# Patient Record
Sex: Male | Born: 1975
Health system: Southern US, Community
[De-identification: ages and names within clinical notes are randomized; demographics above are authoritative.]

## PROBLEM LIST (undated history)

## (undated) DIAGNOSIS — E785 Hyperlipidemia, unspecified: Secondary | ICD-10-CM

## (undated) DIAGNOSIS — G4700 Insomnia, unspecified: Secondary | ICD-10-CM

## (undated) HISTORY — PX: SHOULDER ARTHROSCOPY: SHX128

## (undated) HISTORY — PX: HYDROCELE EXCISION / REPAIR: SUR1145

## (undated) HISTORY — PX: TENDON REPAIR: SHX5111

## (undated) HISTORY — DX: Hyperlipidemia, unspecified: E78.5

## (undated) HISTORY — DX: Insomnia, unspecified: G47.00

---

## 2005-11-14 ENCOUNTER — Emergency Department (HOSPITAL_COMMUNITY): Admission: EM | Admit: 2005-11-14 | Discharge: 2005-11-14 | Payer: Self-pay | Admitting: Family Medicine

## 2005-12-22 ENCOUNTER — Emergency Department (HOSPITAL_COMMUNITY): Admission: EM | Admit: 2005-12-22 | Discharge: 2005-12-22 | Payer: Self-pay | Admitting: Emergency Medicine

## 2006-12-24 ENCOUNTER — Encounter: Admission: RE | Admit: 2006-12-24 | Discharge: 2006-12-24 | Payer: Self-pay | Admitting: Orthopedic Surgery

## 2007-01-11 ENCOUNTER — Emergency Department (HOSPITAL_COMMUNITY): Admission: EM | Admit: 2007-01-11 | Discharge: 2007-01-11 | Payer: Self-pay | Admitting: Family Medicine

## 2007-02-10 ENCOUNTER — Emergency Department (HOSPITAL_COMMUNITY): Admission: EM | Admit: 2007-02-10 | Discharge: 2007-02-10 | Payer: Self-pay | Admitting: Family Medicine

## 2014-05-09 ENCOUNTER — Encounter (HOSPITAL_BASED_OUTPATIENT_CLINIC_OR_DEPARTMENT_OTHER): Payer: Self-pay | Admitting: Emergency Medicine

## 2014-05-09 ENCOUNTER — Emergency Department (HOSPITAL_BASED_OUTPATIENT_CLINIC_OR_DEPARTMENT_OTHER)
Admission: EM | Admit: 2014-05-09 | Discharge: 2014-05-09 | Disposition: A | Discharge status: Home / Self Care | Payer: 59 | Attending: Emergency Medicine | Admitting: Emergency Medicine

## 2014-05-09 DIAGNOSIS — Z23 Encounter for immunization: Secondary | ICD-10-CM | POA: Insufficient documentation

## 2014-05-09 DIAGNOSIS — Z203 Contact with and (suspected) exposure to rabies: Secondary | ICD-10-CM

## 2014-05-09 MED ORDER — RABIES VACCINE, PCEC IM SUSR
1.0000 mL | Freq: Once | INTRAMUSCULAR | Status: AC
  Administered 2014-05-09: 1 mL via INTRAMUSCULAR
  Filled 2014-05-09 (×2): qty 1

## 2014-05-09 MED ORDER — RABIES IMMUNE GLOBULIN 150 UNIT/ML IM INJ
20.0000 [IU]/kg | INJECTION | Freq: Once | INTRAMUSCULAR | Status: AC
  Administered 2014-05-09: 1725 [IU] via INTRAMUSCULAR
  Filled 2014-05-09: qty 12

## 2014-05-09 NOTE — ED Provider Notes (Signed)
TIME SEEN: 4:10 PM  CHIEF COMPLAINT: Possible rabies exposure  HPI: Patient is a 38 y.o. M with no significant past medical history who is up-to-date on his vaccinations who presents to the emergency department with a possible rabies exposure. Patient states that 2 days ago he helped a friend of the family get rid of a bat that they found in the their house. He states that that was asleep on the top of a picture frame. He states he grabbed the bat with a handful of paper towels. He states he was wearing thin latex gloves. He reports he took the bat outside and killed it by stepping on its head and crushing its skull. He denies any bites. He threw the bat in a trash can after he killed it. When the family talked to animal control today, they recommended the patient's and family of the house but that was found come to the emergency department to receive the rabies vaccination and immunoglobulin. They state the bat cannot be used for testing because it was in the heat for over 24 hours.  Patient states he is feeling well. He denies any current medical complaints.  ROS: See HPI Constitutional: no fever  Eyes: no drainage  ENT: no runny nose   Cardiovascular:  no chest pain  Resp: no SOB  GI: no vomiting GU: no dysuria Integumentary: no rash  Allergy: no hives  Musculoskeletal: no leg swelling  Neurological: no slurred speech ROS otherwise negative  PAST MEDICAL HISTORY/PAST SURGICAL HISTORY:  History reviewed. No pertinent past medical history.  MEDICATIONS:  Prior to Admission medications   Not on File    ALLERGIES:  No Known Allergies  SOCIAL HISTORY:  History  Substance Use Topics  . Smoking status: Never Smoker   . Smokeless tobacco: Not on file  . Alcohol Use: Yes    FAMILY HISTORY: No family history on file.  EXAM: BP 125/95  Pulse 59  Temp(Src) 98.2 F (36.8 C) (Oral)  Resp 16  Wt 189 lb 1.6 oz (85.775 kg)  SpO2 100% CONSTITUTIONAL: Alert and oriented and responds  appropriately to questions. Well-appearing; well-nourished, pleasant, smiling, nontoxic HEAD: Normocephalic EYES: Conjunctivae clear, PERRL ENT: normal nose; no rhinorrhea; moist mucous membranes; pharynx without lesions noted NECK: Supple, no meningismus, no LAD  CARD: RRR; S1 and S2 appreciated; no murmurs, no clicks, no rubs, no gallops RESP: Normal chest excursion without splinting or tachypnea; breath sounds clear and equal bilaterally; no wheezes, no rhonchi, no rales,  ABD/GI: Normal bowel sounds; non-distended; soft, non-tender, no rebound, no guarding BACK:  The back appears normal and is non-tender to palpation, there is no CVA tenderness EXT: Normal ROM in all joints; non-tender to palpation; no edema; normal capillary refill; no cyanosis    SKIN: Normal color for age and race; warm NEURO: Moves all extremities equally, sensation to light touch intact diffusely, cranial nerves II through XII intact, normal gait PSYCH: The patient's mood and manner are appropriate. Grooming and personal hygiene are appropriate.  MEDICAL DECISION MAKING: Patient here with possible rabies exposure. We'll give immunoglobulin and vaccine. We'll give the vaccine schedule and they will followup as an outpatient for further vaccinations. Have discussed supportive care instructions and return precautions. Patient verbalizes understanding and is comfortable plan.     [place], DO 05/09/14 1614

## 2014-05-09 NOTE — ED Notes (Signed)
Bat exposure 6/23

## 2014-05-09 NOTE — ED Notes (Signed)
MD at bedside. 

## 2014-05-09 NOTE — Discharge Instructions (Signed)
Rabies Vaccine   What You Need to Know   WHAT IS RABIES?   Rabies is a serious disease. It is caused by a virus.   Rabies is mainly a disease of animals. Humans get rabies when they are bitten by infected animals.   At first there might not be any symptoms. But weeks, or even years after a bite, rabies can cause pain, fatigue, headaches, fever, and irritability. These are followed by seizures, hallucinations, and paralysis. Human rabies is almost always fatal.   Wild animals, especially bats, are the most common source of human rabies infection in the [place]. Skunks, raccoons, dogs, cats, coyotes, foxes, and other mammals can also transmit the disease.   Human rabies is rare in the [place]. There have been only 55 cases diagnosed since 1990. However, between 16,000 and 39,000 people are vaccinated each year as a precaution after animal bites. Also, rabies is far more common in other parts of the world, with about 40,000 to 70,000 rabies-related deaths worldwide each year. Bites from unvaccinated dogs cause most of these cases.  Rabies vaccine can prevent rabies.   RABIES VACCINE   Rabies vaccine is given to people at high risk of rabies to protect them if they are exposed. It can also prevent the disease if it is given to a person after they have been exposed.   Rabies vaccine is made from killed rabies virus. It cannot cause rabies.  WHO SHOULD GET RABIES VACCINE AND WHEN?   Preventive Vaccination (No Exposure)   People at high risk of exposure to rabies, such as veterinarians, animal handlers, rabies laboratory workers, spelunkers, and rabies biologics production workers should be offered rabies vaccine.   The vaccine should also be considered for:   People whose activities bring them into frequent contact with rabies virus or with possibly rabid animals.   International travelers who are likely to come in contact with animals in parts of the world where rabies is common.  The pre-exposure schedule  for rabies vaccination is 3 doses, given at the following times:   Dose 1: As appropriate.   Dose 2: 7 days after Dose 1.   Dose 3: 21 days or 28 days after Dose 1.  For laboratory workers and others who may be repeatedly exposed to rabies virus, periodic testing for immunity is recommended and booster doses should be given as needed. (Testing or booster doses are not recommended for travelers). Ask your doctor for details.  Vaccination After an Exposure   Anyone who has been bitten by an animal, or who otherwise may have been exposed to rabies, should clean the wound and see a doctor immediately. The doctor will determine if they need to be vaccinated.   A person who is exposed and has never been vaccinated against rabies should get 4 doses of rabies vaccine: one dose right away and additional doses on the 3rd, 7th, and 14th days. They should also get another shot called Rabies Immune Globulin at the same time as the first dose.   A person who has been previously vaccinated should get 2 doses of rabies vaccine: one right away and another on the 3rd day. Rabies Immune Globulin is not needed.   TELL YOUR DOCTOR IF:   Talk with a doctor before getting rabies vaccine if you:   Ever had a serious (life-threatening) allergic reaction to a previous dose of rabies vaccine or to any component of the vaccine; tell your doctor if you have any   severe allergies.   Have a weakened immune system because of:   HIV, AIDS, or another disease that affects the immune system.   Treatment with drugs that affect the immune system, such as steroids.   Cancer or cancer treatment with radiation or drugs.  If you have a minor illness, such as a cold, you can be vaccinated. If you are moderately or severely ill, you should probably wait until you recover before getting a routine (non-exposure) dose of rabies vaccine.   If you have been exposed to rabies virus, you should get the vaccine regardless of any other illnesses you may have.   WHAT  ARE THE RISKS FROM RABIES VACCINE?   A vaccine, like any medicine, is capable of causing serious problems, such as severe allergic reactions. The risk of a vaccine causing serious harm, or death, is extremely small. Serious problems from rabies vaccine are very rare.   Mild problems:   Soreness, redness, swelling, or itching where the shot was given (30% to 74%).   Headache, nausea, abdominal pain, muscle aches, or dizziness (5% to 40%).  Moderate problems:   Hives, pain in the joints, or fever (about 6% of booster doses).   Other nervous system disorders, such as Guillain-Barré Syndrome (GBS), have been reported after rabies vaccine, but this happens so rarely that it is not known whether they are related to the vaccine.  Note: Several brands of rabies vaccine are available in the [place], and reactions may vary between brands. Your provider can give you more information about a particular brand.   WHAT IF THERE IS A SERIOUS REACTION?   What should I look for?   Look for anything that concerns you, such as signs of a severe allergic reaction, very high fever, or behavior changes.   Signs of a severe allergic reaction can include hives, swelling of the face and throat, difficulty breathing, a fast heartbeat, dizziness, and weakness. These would start a few minutes to a few hours after the vaccination.   What should I do?   If you think it is a severe allergic reaction or other emergency that cannot wait, call 911 or get the person to the nearest hospital. Otherwise, call your doctor.   Afterward, the reaction should be reported to the Vaccine Adverse Event Reporting System (VAERS). Your doctor might file this report, or you can do it yourself through the VAERS website at www.vaers.hhs.gov or by calling 1-800-822-7967.  VAERS is only for reporting reactions. They do not give medical advice.   HOW CAN I LEARN MORE?   Ask your doctor.   Call your local or state health department.   Contact the Centers for Disease  Control and Prevention (CDC):   Visit the CDC rabies website at www.cdc.gov/rabies/  CDC Rabies Vaccine VIS (08/20/08)   Document Released: 08/29/2006 Document Revised: 10/18/2012 Document Reviewed: 02/21/2013   ExitCare® Patient Information ©2015 [employer]. This information is not intended to replace advice given to you by your health care provider. Make sure you discuss any questions you have with your health care provider.

## 2014-05-12 ENCOUNTER — Emergency Department (HOSPITAL_COMMUNITY)
Admission: EM | Admit: 2014-05-12 | Discharge: 2014-05-12 | Disposition: A | Discharge status: Home / Self Care | Payer: 59 | Source: Home / Self Care

## 2014-05-12 ENCOUNTER — Encounter (HOSPITAL_COMMUNITY): Payer: Self-pay | Admitting: Emergency Medicine

## 2014-05-12 MED ORDER — RABIES VACCINE, PCEC IM SUSR
INTRAMUSCULAR | Status: AC
  Filled 2014-05-12: qty 1

## 2014-05-12 MED ORDER — RABIES VACCINE, PCEC IM SUSR
1.0000 mL | Freq: Once | INTRAMUSCULAR | Status: AC
  Administered 2014-05-12: 1 mL via INTRAMUSCULAR

## 2014-05-12 NOTE — ED Notes (Signed)
Patient presents for second rabies injection. Denies rx to previous injections. Patient is aware of his schedule for vaccines.

## 2014-05-16 ENCOUNTER — Emergency Department
Admission: EM | Admit: 2014-05-16 | Discharge: 2014-05-16 | Disposition: A | Discharge status: Home / Self Care | Payer: 59 | Source: Home / Self Care

## 2014-05-16 ENCOUNTER — Encounter: Payer: Self-pay | Admitting: Emergency Medicine

## 2014-05-16 DIAGNOSIS — Z23 Encounter for immunization: Secondary | ICD-10-CM

## 2014-05-16 DIAGNOSIS — Z203 Contact with and (suspected) exposure to rabies: Secondary | ICD-10-CM

## 2014-05-16 MED ORDER — RABIES VACCINE, PCEC IM SUSR
1.0000 mL | Freq: Once | INTRAMUSCULAR | Status: AC
  Administered 2014-05-16: 1 mL via INTRAMUSCULAR

## 2014-05-16 NOTE — ED Notes (Signed)
Here for #3 of Rabies vaccination series. No previous adverse reaction to this vaccination.

## 2014-05-22 NOTE — ED Notes (Signed)
Mr. Sponaugle called and is on vacation at [facility], [place].  States he needs an order faxed to the [facility] , Attn: Janett Billow, Out Pt inorder to receive his 4th rabies vaccine.  Chart reviewed with Dr. Leonides Schanz.  Order written for Rabies Vaccine, 1 ML IM to be given on 05/23/14.  Faxed to [phone].

## 2015-05-22 ENCOUNTER — Ambulatory Visit: Payer: Self-pay | Admitting: Sports Medicine

## 2015-05-27 ENCOUNTER — Encounter: Payer: Self-pay | Admitting: Sports Medicine

## 2015-05-27 ENCOUNTER — Ambulatory Visit (INDEPENDENT_AMBULATORY_CARE_PROVIDER_SITE_OTHER): Payer: 59 | Admitting: Sports Medicine

## 2015-05-27 VITALS — BP 114/81 | Ht 70.0 in | Wt 185.0 lb

## 2015-05-27 DIAGNOSIS — M25562 Pain in left knee: Secondary | ICD-10-CM

## 2015-05-27 NOTE — Progress Notes (Signed)
  Patrick Clayton - 39 y.o. male MRN [id number]  Date of birth: 19-Dec-1975  SUBJECTIVE:  Including CC & ROS.   New patient evaluation for left posterior knee pain. Pain has been present since he was about 39 years old. Denies any trauma or major injury to the knee. When he was younger he first noticed the pain when in cross-legged sitting position, felt like the leg would get very tight and had to slowly extend his knee getting out of this position. As he has gotten older, he develops progressive tightening over a period of about 3 weeks where eventually he feels the back of the leg get very tight and eventually "pops" on extension and causes significant relief of the pain. This cycle occurs every 3-4 months. He denies any swelling of the leg, doesn't get any numbness or tingling and does not feel weak. He is physically active, including weight lifting (used to do a lot of squats per note he brings in from orthopedic doctor 4 years ago) and running 12 miles a week; notably he did . He has been evaluated by several orthopedic doctors and normal MRI, and recently underwent more physical therapy visits in June that had a "popping" episode on June 30th and has been doing much better.  Orthopedic notes suggest when he started a group of jump squats he worsened his symptoms and he gets some locking at times.   HISTORY: Past Medical, Surgical, Social, and Family History Reviewed & Updated per EMR. Pertinent Historical Findings include: Right MCL tear Left shoulder surgery?  PHYSICAL EXAM:  VS: BP:114/81 mmHg  HR: bpm  TEMP: ( )  RESP:   HT:5\' 10"  (177.8 cm)   WT:185 lb (83.915 kg)  BMI:26.6 PHYSICAL EXAM: General: NAD, pleasant Skin: no rashes, no erythema, no ecchymoses Neuro: alert and oriented x 3, no sensory deficits of LE.  MSK: Knee: Normal to inspection with no erythema or effusion or obvious bony abnormalities. Palpation normal with no warmth or joint line tenderness or patellar tenderness  or condyle tenderness. ROM normal in flexion and extension and lower leg rotation. Ligaments with solid consistent endpoints including ACL, PCL, LCL, MCL. Patellar and quadriceps tendons unremarkable. Hamstring and quadriceps strength is normal. Provocative testing: Dial test positive: extended = left side rotation 60 degrees and 45 degrees, right side rotation = 60 degrees and 20 degrees. With 90 degree flexion both sides are 60 degrees and 45 degrees.  Ultrasound: apparent posterior lateral corner soft tissue tear with loose soft tissue seen. Post horn of meniscus appears intact.  There is some hypoechoic change near area of tissue irregularity.  ASSESSMENT & PLAN: See problem based charting & AVS for pt instructions.

## 2015-05-27 NOTE — Patient Instructions (Signed)
Hamstring curls  60lb over shoulder, step out with your foot inwardly rotated (pointing toward opposite side of body), each step you do lateral, in front of your body, and slightly across midline. 3 sets of 15 for each position. When starting this be careful with your steps, you may want to do it without weights until you get the form down.  Squats down to about 60 degrees  At least 3 times per week, more would be better if you can tolerate.  Call around 6 weeks to report how you are doing, and if not getting a lot better schedule an appt. Likely repeat the ultrasound in about 3 months.

## 2015-05-27 NOTE — Assessment & Plan Note (Addendum)
Mechanism consistent with posterolateral corner injury. Positive dial testing on the left with increased rotation seen. Ultrasound of the posterior knee demonstrated an area of apparent free soft tissue (?ligament vs capsule vs cartilage) that is likely causing the popping sensation he is getting. Went over HEP (see AVS). Have him follow up via phone in 6 weeks to touch base and bring back if needed then, otherwise likely f/u at 3 months with repeat ultrasound.  [facility]

## 2016-08-10 ENCOUNTER — Encounter: Payer: Self-pay | Admitting: Sports Medicine

## 2016-08-10 ENCOUNTER — Ambulatory Visit (INDEPENDENT_AMBULATORY_CARE_PROVIDER_SITE_OTHER): Payer: 59 | Admitting: Sports Medicine

## 2016-08-10 VITALS — BP 116/76 | HR 54 | Ht 70.0 in | Wt 177.0 lb

## 2016-08-10 DIAGNOSIS — M25562 Pain in left knee: Secondary | ICD-10-CM | POA: Diagnosis not present

## 2016-08-10 NOTE — Progress Notes (Signed)
Chief complaint: left anterior knee pain  Patient had a remote injury in childhood that affected his left knee We saw him this past year for some posterior lateral corner issues of the left knee These improved with treatment  Over the past 3 months he has been doing a lot of training in which he was running both up and downhill He is getting anterior knee pain This is associated with slight lateral swelling Much worse on downhill Pain became fairly severe during the [place] relay  Social history-nonsmoker Works out regularly with running exercises and spin  Review of systems No locking No giving way Pain any 1 leg squat or kneel bend Pain with stepping down with his left foot  Physical examination Muscular white male in no acute distress BP 116/76   Pulse (!) 54   Ht 5\' 10"  (1.778 m)   Wt 177 lb (80.3 kg)   BMI 25.40 kg/m   Knee Left Normal to inspection with no erythema or effusion or obvious bony abnormalities. Palpation normal with no warmth or joint line tenderness or patellar tenderness or condyle tenderness. ROM normal in flexion and extension and lower leg rotation. Ligaments with solid consistent endpoints including ACL, PCL, LCL, MCL. Negative Mcmurray's and provocative meniscal tests.  Painful lateral patellar compression. Lateral crepitation beneath patella Lateral patellar tracking Note not noted on comparison exam of RT knee  Patellar and quadriceps tendons unremarkable. Hamstring and quadriceps strength is normal. Excellent hip abduction strength

## 2016-08-10 NOTE — Assessment & Plan Note (Signed)
We'll try open patellar compression sleeve Exercises to emphasize medial and VMO strength Avoid downhill running Avoid squats and 1 leg exercise  Referral to physical therapy to try to work with him on dynamic exercises he cannot really do even a step down without pain today  Recheck 6 weeks

## 2016-11-30 ENCOUNTER — Ambulatory Visit (INDEPENDENT_AMBULATORY_CARE_PROVIDER_SITE_OTHER): Payer: 59 | Admitting: Sports Medicine

## 2016-11-30 ENCOUNTER — Encounter: Payer: Self-pay | Admitting: Sports Medicine

## 2016-11-30 DIAGNOSIS — M5432 Sciatica, left side: Secondary | ICD-10-CM

## 2016-11-30 MED ORDER — GABAPENTIN 300 MG PO CAPS: 300.0000 mg | ORAL_CAPSULE | Freq: Every day | ORAL | 2 refills | Status: DC

## 2016-11-30 MED ORDER — VITAMIN B-6 100 MG PO TABS: 100.0000 mg | ORAL_TABLET | Freq: Every day | ORAL | 1 refills | Status: DC

## 2016-11-30 NOTE — Progress Notes (Signed)
    Subjective:  Patrick Clayton is a 41 y.o. male who presents to the [facility] today with a chief complaint of left leg pain.   HPI:  Left Leg Pain Symptoms started about 2 days ago. Five days ago the patient ran 9 miles and ran 6 miles the day after. Two days ago he started noticed some pain in his left buttocks and hamstring area that felt like a "tightness." Pain radiates into the back of his knee, but not into his feet. He has been having some numbness and tingling into his feet. Over the past couple of days, he has noticed an improvement in his pain. He has tried taking meloxicam, ibuprofen, and tylenol which have helped some. Pain is described as a shooting pain.  ROS: Per HPI No LBP No deep buttocks pain  PMH: Smoking history reviewed.   Objective:  Physical Exam: BP 114/72   Ht 5\' 10"  (1.778 m)   Wt 175 lb (79.4 kg)   BMI 25.11 kg/m   Gen: NAD, resting comfortably MSK:  - LLE: No deformities. Mildly tender to palpation just lateral to ischial tuberosity. FROM. Strength 5/5 including hamstrings with no significant pain elicited. Strait leg raise negative. No pain with forward lunge. Askling H test negative. Neurovascularly intact distally.  - RLE: No deformities. Nontender to palpation. FROM. Strength 5/5 throughout. No pain with forward lung. askling H test negative. Straight leg raise negative. Neurovascularly intact distally.   Assessment/Plan:  Left sciatic nerve pain Pain likely secondary to sciatic nerve irritation/stretching given his area of tenderness and radiation of tingling into his feet. Doubt hamstring tear as patient has 5/5 strength and no pain with resistance. No red flag signs or symptoms. Discussed home exercise program including Diver's stretches, Extender stretches, and hamstring curls and swings. Will start gabapentin 300mg  at night and vitamin B6. Follow up in 1 month.   Algis Greenhouse. Jerline Pain, [place] Resident PGY-3 11/30/2016 5:02 PM  I  observed and examined the patient with the resident and agree with assessment and plan.  Note reviewed and modified by me. Stefanie Libel, MD

## 2016-11-30 NOTE — Patient Instructions (Signed)
Do four sets of 8 of each the diver and extender. Do three sets of 15 of the curls and swings. Start the gabapentin 300mg  at night. Start the vitamin B6  Come back in 4 weeks.

## 2016-11-30 NOTE — Assessment & Plan Note (Signed)
Pain likely secondary to sciatic nerve irritation/stretching given his area of tenderness and radiation of tingling into his feet. Doubt hamstring tear as patient has 5/5 strength and no pain with resistance. No red flag signs or symptoms. Discussed home exercise program including Diver's stretches, Extender stretches, and hamstring curls and swings. Will start gabapentin 300mg  at night and vitamin B6. Follow up in 1 month.

## 2016-12-03 ENCOUNTER — Encounter: Payer: Self-pay | Admitting: Sports Medicine

## 2016-12-21 ENCOUNTER — Other Ambulatory Visit: Payer: Self-pay | Admitting: Orthopedic Surgery

## 2016-12-21 DIAGNOSIS — M5416 Radiculopathy, lumbar region: Secondary | ICD-10-CM

## 2016-12-22 ENCOUNTER — Ambulatory Visit
Admission: RE | Admit: 2016-12-22 | Discharge: 2016-12-22 | Disposition: A | Discharge status: Home / Self Care | Payer: 59 | Source: Ambulatory Visit | Attending: Orthopedic Surgery | Admitting: Orthopedic Surgery

## 2016-12-22 DIAGNOSIS — M5416 Radiculopathy, lumbar region: Secondary | ICD-10-CM

## 2016-12-22 MED ORDER — IOPAMIDOL (ISOVUE-M 200) INJECTION 41%
1.0000 mL | Freq: Once | INTRAMUSCULAR | Status: AC
  Administered 2016-12-22: 1 mL via EPIDURAL

## 2016-12-22 MED ORDER — METHYLPREDNISOLONE ACETATE 40 MG/ML INJ SUSP (RADIOLOG
120.0000 mg | Freq: Once | INTRAMUSCULAR | Status: AC
  Administered 2016-12-22: 120 mg via EPIDURAL

## 2016-12-22 NOTE — Discharge Instructions (Signed)

## 2016-12-30 ENCOUNTER — Other Ambulatory Visit: Payer: Self-pay | Admitting: *Deleted

## 2016-12-30 MED ORDER — GABAPENTIN 300 MG PO CAPS: ORAL_CAPSULE | ORAL | 2 refills | Status: DC

## 2017-01-04 ENCOUNTER — Ambulatory Visit: Payer: 59 | Admitting: Sports Medicine

## 2017-07-21 ENCOUNTER — Other Ambulatory Visit: Payer: Self-pay | Admitting: Orthopedic Surgery

## 2017-07-21 DIAGNOSIS — M5416 Radiculopathy, lumbar region: Secondary | ICD-10-CM

## 2017-08-02 ENCOUNTER — Ambulatory Visit
Admission: RE | Admit: 2017-08-02 | Discharge: 2017-08-02 | Disposition: A | Discharge status: Home / Self Care | Payer: 59 | Source: Ambulatory Visit | Attending: Orthopedic Surgery | Admitting: Orthopedic Surgery

## 2017-08-02 DIAGNOSIS — M5416 Radiculopathy, lumbar region: Secondary | ICD-10-CM

## 2017-08-02 MED ORDER — IOPAMIDOL (ISOVUE-M 200) INJECTION 41%
1.0000 mL | Freq: Once | INTRAMUSCULAR | Status: AC
  Administered 2017-08-02: 1 mL via EPIDURAL

## 2017-08-02 MED ORDER — METHYLPREDNISOLONE ACETATE 40 MG/ML INJ SUSP (RADIOLOG
120.0000 mg | Freq: Once | INTRAMUSCULAR | Status: AC
  Administered 2017-08-02: 120 mg via EPIDURAL

## 2017-08-02 NOTE — Discharge Instructions (Signed)

## 2017-10-12 ENCOUNTER — Other Ambulatory Visit: Payer: Self-pay | Admitting: Orthopedic Surgery

## 2017-10-12 DIAGNOSIS — M5416 Radiculopathy, lumbar region: Secondary | ICD-10-CM

## 2017-11-14 ENCOUNTER — Ambulatory Visit
Admission: RE | Admit: 2017-11-14 | Discharge: 2017-11-14 | Disposition: A | Discharge status: Home / Self Care | Payer: 59 | Source: Ambulatory Visit | Attending: Orthopedic Surgery | Admitting: Orthopedic Surgery

## 2017-11-14 DIAGNOSIS — M5416 Radiculopathy, lumbar region: Secondary | ICD-10-CM

## 2017-11-14 MED ORDER — METHYLPREDNISOLONE ACETATE 40 MG/ML INJ SUSP (RADIOLOG
120.0000 mg | Freq: Once | INTRAMUSCULAR | Status: AC
  Administered 2017-11-14: 120 mg via EPIDURAL

## 2017-11-14 MED ORDER — IOPAMIDOL (ISOVUE-M 200) INJECTION 41%
1.0000 mL | Freq: Once | INTRAMUSCULAR | Status: AC
  Administered 2017-11-14: 1 mL via EPIDURAL

## 2017-11-14 NOTE — Discharge Instructions (Signed)

## 2017-11-23 ENCOUNTER — Other Ambulatory Visit: Payer: 59

## 2017-11-25 ENCOUNTER — Other Ambulatory Visit: Payer: 59

## 2017-12-05 ENCOUNTER — Other Ambulatory Visit: Payer: Self-pay | Admitting: Orthopedic Surgery

## 2017-12-05 DIAGNOSIS — M5416 Radiculopathy, lumbar region: Secondary | ICD-10-CM

## 2017-12-06 ENCOUNTER — Ambulatory Visit
Admission: RE | Admit: 2017-12-06 | Discharge: 2017-12-06 | Disposition: A | Discharge status: Home / Self Care | Payer: 59 | Source: Ambulatory Visit | Attending: Orthopedic Surgery | Admitting: Orthopedic Surgery

## 2017-12-06 DIAGNOSIS — M5416 Radiculopathy, lumbar region: Secondary | ICD-10-CM

## 2017-12-06 MED ORDER — IOPAMIDOL (ISOVUE-M 200) INJECTION 41%
1.0000 mL | Freq: Once | INTRAMUSCULAR | Status: AC
  Administered 2017-12-06: 1 mL via EPIDURAL

## 2017-12-06 MED ORDER — METHYLPREDNISOLONE ACETATE 40 MG/ML INJ SUSP (RADIOLOG
120.0000 mg | Freq: Once | INTRAMUSCULAR | Status: AC
  Administered 2017-12-06: 120 mg via EPIDURAL

## 2017-12-06 NOTE — Discharge Instructions (Signed)

## 2017-12-21 ENCOUNTER — Encounter (HOSPITAL_COMMUNITY): Payer: Self-pay

## 2017-12-21 ENCOUNTER — Ambulatory Visit (HOSPITAL_COMMUNITY)
Admission: EM | Admit: 2017-12-21 | Discharge: 2017-12-22 | Disposition: A | Discharge status: Home / Self Care | Payer: 59 | Attending: Neurosurgery | Admitting: Neurosurgery

## 2017-12-21 ENCOUNTER — Other Ambulatory Visit: Payer: Self-pay

## 2017-12-21 ENCOUNTER — Observation Stay (HOSPITAL_COMMUNITY): Payer: 59

## 2017-12-21 DIAGNOSIS — R531 Weakness: Secondary | ICD-10-CM | POA: Insufficient documentation

## 2017-12-21 DIAGNOSIS — M5117 Intervertebral disc disorders with radiculopathy, lumbosacral region: Secondary | ICD-10-CM | POA: Insufficient documentation

## 2017-12-21 DIAGNOSIS — M5126 Other intervertebral disc displacement, lumbar region: Secondary | ICD-10-CM | POA: Diagnosis present

## 2017-12-21 DIAGNOSIS — R29818 Other symptoms and signs involving the nervous system: Secondary | ICD-10-CM | POA: Diagnosis not present

## 2017-12-21 DIAGNOSIS — Z79899 Other long term (current) drug therapy: Secondary | ICD-10-CM | POA: Diagnosis not present

## 2017-12-21 DIAGNOSIS — G8929 Other chronic pain: Secondary | ICD-10-CM | POA: Diagnosis present

## 2017-12-21 DIAGNOSIS — Z419 Encounter for procedure for purposes other than remedying health state, unspecified: Secondary | ICD-10-CM

## 2017-12-21 DIAGNOSIS — M5442 Lumbago with sciatica, left side: Secondary | ICD-10-CM

## 2017-12-21 LAB — CBC WITH DIFFERENTIAL/PLATELET
Basophils Absolute: 0 10*3/uL (ref 0.0–0.1)
Basophils Relative: 0 %
EOS ABS: 0 10*3/uL (ref 0.0–0.7)
EOS PCT: 0 %
HCT: 41.9 % (ref 39.0–52.0)
Hemoglobin: 14.7 g/dL (ref 13.0–17.0)
LYMPHS ABS: 1.9 10*3/uL (ref 0.7–4.0)
LYMPHS PCT: 19 %
MCH: 32.9 pg (ref 26.0–34.0)
MCHC: 35.1 g/dL (ref 30.0–36.0)
MCV: 93.7 fL (ref 78.0–100.0)
MONOS PCT: 6 %
Monocytes Absolute: 0.6 10*3/uL (ref 0.1–1.0)
Neutro Abs: 7.5 10*3/uL (ref 1.7–7.7)
Neutrophils Relative %: 75 %
PLATELETS: 238 10*3/uL (ref 150–400)
RBC: 4.47 MIL/uL (ref 4.22–5.81)
RDW: 12.6 % (ref 11.5–15.5)
WBC: 9.9 10*3/uL (ref 4.0–10.5)

## 2017-12-21 MED ORDER — ONDANSETRON HCL 4 MG/2ML IJ SOLN: 4.0000 mg | Freq: Four times a day (QID) | INTRAMUSCULAR | Status: DC | PRN

## 2017-12-21 MED ORDER — ACETAMINOPHEN 650 MG RE SUPP: 650.0000 mg | Freq: Four times a day (QID) | RECTAL | Status: DC | PRN

## 2017-12-21 MED ORDER — GABAPENTIN 300 MG PO CAPS
300.0000 mg | ORAL_CAPSULE | Freq: Three times a day (TID) | ORAL | Status: DC
  Administered 2017-12-21 – 2017-12-22 (×2): 300 mg via ORAL
  Filled 2017-12-21 (×2): qty 1

## 2017-12-21 MED ORDER — SODIUM CHLORIDE 0.9% FLUSH: 3.0000 mL | Freq: Two times a day (BID) | INTRAVENOUS | Status: DC

## 2017-12-21 MED ORDER — VITAMIN B-6 100 MG PO TABS: 100.0000 mg | ORAL_TABLET | Freq: Every day | ORAL | Status: DC

## 2017-12-21 MED ORDER — KETOROLAC TROMETHAMINE 30 MG/ML IJ SOLN: 30.0000 mg | Freq: Four times a day (QID) | INTRAMUSCULAR | Status: DC | PRN

## 2017-12-21 MED ORDER — SODIUM CHLORIDE 0.9 % IV SOLN: 250.0000 mL | INTRAVENOUS | Status: DC | PRN

## 2017-12-21 MED ORDER — SODIUM CHLORIDE 0.9% FLUSH
3.0000 mL | INTRAVENOUS | Status: DC | PRN
Start: 2017-12-21 — End: 2017-12-22

## 2017-12-21 MED ORDER — OXYCODONE HCL 5 MG PO TABS: 5.0000 mg | ORAL_TABLET | ORAL | Status: DC | PRN

## 2017-12-21 MED ORDER — ONDANSETRON HCL 4 MG PO TABS: 4.0000 mg | ORAL_TABLET | Freq: Four times a day (QID) | ORAL | Status: DC | PRN

## 2017-12-21 MED ORDER — POTASSIUM CHLORIDE IN NACL 20-0.9 MEQ/L-% IV SOLN
INTRAVENOUS | Status: DC
  Administered 2017-12-22: 01:00:00 via INTRAVENOUS
  Filled 2017-12-21: qty 1000

## 2017-12-21 MED ORDER — ZOLPIDEM TARTRATE 5 MG PO TABS: 5.0000 mg | ORAL_TABLET | Freq: Every evening | ORAL | Status: DC | PRN

## 2017-12-21 MED ORDER — ACETAMINOPHEN 325 MG PO TABS
650.0000 mg | ORAL_TABLET | Freq: Four times a day (QID) | ORAL | Status: DC | PRN
Start: 2017-12-21 — End: 2017-12-22

## 2017-12-21 NOTE — ED Notes (Signed)
Paged Dr. Christella Noa to [place], charge RN

## 2017-12-21 NOTE — H&P (Signed)
Patrick Clayton is an 42 y.o. male.   Chief Complaint: left lower extremity pain HPI: whom after one year of pain and repeated "flare ups", has decided with a fairly severe exacerbation, and evid  History reviewed. No pertinent past medical history.  History reviewed. No pertinent surgical history.  History reviewed. No pertinent family history. Social History:  reports that  has never smoked. he has never used smokeless tobacco. He reports that he drinks alcohol. He reports that he does not use drugs.  Allergies: No Known Allergies   (Not in a hospital admission)  No results found for this or any previous visit (from the past 48 hour(s)). No results found.  Review of Systems  HENT: Negative.   Eyes: Negative.   Respiratory: Negative.   Cardiovascular: Negative.   Gastrointestinal: Negative.   Genitourinary: Negative.   Musculoskeletal: Positive for back pain.  Skin: Negative.   Neurological: Positive for focal weakness and weakness.  Endo/Heme/Allergies: Negative.   Psychiatric/Behavioral: Negative.     Blood pressure 118/72, pulse 65, temperature 98.7 F (37.1 C), temperature source Oral, resp. rate 14, SpO2 100 %. Physical Exam  Constitutional: He is oriented to person, place, and time. He appears well-developed and well-nourished. No distress.  HENT:  Head: Normocephalic and atraumatic.  Right Ear: External ear normal.  Left Ear: External ear normal.  Mouth/Throat: Oropharynx is clear and moist.  Eyes: Conjunctivae and EOM are normal. Pupils are equal, round, and reactive to light.  Neck: Normal range of motion. Neck supple.  Cardiovascular: Normal rate, regular rhythm and normal heart sounds.  Respiratory: Effort normal and breath sounds normal.  GI: Soft. Bowel sounds are normal.  Musculoskeletal: Normal range of motion.  Neurological: He is alert and oriented to person, place, and time. He has normal strength. A cranial nerve deficit is present. No sensory  deficit. He displays a negative Romberg sign. Coordination normal. He displays no Babinski's sign on the right side. He displays Babinski's sign on the left side.  Reflex Scores:      Tricep reflexes are 2+ on the right side and 2+ on the left side.      Bicep reflexes are 2+ on the right side and 2+ on the left side.      Brachioradialis reflexes are 2+ on the right side and 2+ on the left side.      Patellar reflexes are 2+ on the right side and 2+ on the left side.      Achilles reflexes are 2+ on the right side and 0 on the left side. Gait not assessed     Assessment/Plan Patrick Clayton is a 42 y.o. male Whom has a large hnp at L5/S1 eccentric to the left side. BP 118/72 (BP Location: Left Arm)   Pulse 65   Temp 98.7 F (37.1 C) (Oral)   Resp 14   SpO2 100%  Patrick Clayton has decided to undergo a lumbar discetomy/decompression for a herniated disc at levels L5/S1. Risks and benefits including but not limited to bleeding, infection, paralysis, weakness in one or both extremities, bowel and/or bladder dysfunction, need for further surgery, no relief of pain. Patrick Clayton understands and wishes to proceed.  Patrick Savoca L, MD 12/21/2017, 9:21 PM

## 2017-12-21 NOTE — ED Notes (Signed)
The pt  Reports that he is scheduled for surgery tomorrow  He appears comfortable

## 2017-12-21 NOTE — ED Provider Notes (Signed)
[facility] EMERGENCY DEPARTMENT Provider Note   CSN: [id number] Arrival date & time: 12/21/17  1712     History   Chief Complaint Chief Complaint  Patient presents with  . Back Pain    HPI Patrick Clayton is a 42 y.o. male history of L5-S1 disc protrusion with left-sided sciatica set to have surgery tomorrow morning by Dr. Christella Noa who presents the emergency department today for pain.  Patient notes that he is currently on twice daily gabapentin, muscle relaxers, and Tylenol with Codeine.  Patient did receive a Toradol injection today as well as dry needling.  He is presenting today because he is still in 4/10 pain does not feel like he would be able to rest comfortably throughout the night for his surgery tomorrow morning.  Patient notes his pain is at the insertion of the left hamstring into the left gluteus with numbness/tingling radiating down the sciatic nerve distribution.  He denies any increased weakness to this area.  Denies any fever, chills, abdominal pain, urinary symptoms, upper extremity weakness, bowel/bladder incontinence, saddle anesthesia, urinary retention or constipation.  No new trauma. Patient is ambulatory but notes it is painful.   HPI  History reviewed. No pertinent past medical history.  Patient Active Problem List   Diagnosis Date Noted  . Left sciatic nerve pain 11/30/2016  . Left anterior knee pain 08/10/2016  . Posterior left knee pain 05/27/2015    History reviewed. No pertinent surgical history.     Home Medications    Prior to Admission medications   Medication Sig Start Date End Date Taking? Authorizing Provider  FLUCELVAX QUADRIVALENT 0.5 ML SUSY TO BE ADMINISTERED BY PHARMACIST FOR IMMUNIZATION 08/27/16   [provider]  gabapentin (NEURONTIN) 300 MG capsule Take as directed to equal 900mg  daily 12/30/16   Stefanie Libel, MD  meloxicam [facility]) 15 MG tablet  07/15/16   [provider]  pyridOXINE (VITAMIN  B-6) 100 MG tablet Take 1 tablet (100 mg total) by mouth daily. 11/30/16   Vivi Barrack, MD    Family History History reviewed. No pertinent family history.  Social History Social History   Tobacco Use  . Smoking status: Never Smoker  . Smokeless tobacco: Never Used  Substance Use Topics  . Alcohol use: Yes  . Drug use: No     Allergies   Patient has no known allergies.   Review of Systems Review of Systems  All other systems reviewed and are negative.    Physical Exam Updated Vital Signs BP 118/72 (BP Location: Left Arm)   Pulse 65   Temp 98.7 F (37.1 C) (Oral)   Resp 14   SpO2 100%   Physical Exam  Constitutional: He appears well-developed and well-nourished. No distress.  Non-toxic appearing.  Patient lying on right side for comfort.  HENT:  Head: Normocephalic and atraumatic.  Right Ear: External ear normal.  Left Ear: External ear normal.  Neck: Normal range of motion. Neck supple. No spinous process tenderness present. No neck rigidity. Normal range of motion present.  Cardiovascular: Normal rate, regular rhythm, normal heart sounds and intact distal pulses.  No murmur heard. Pulses:      Radial pulses are 2+ on the right side, and 2+ on the left side.       Femoral pulses are 2+ on the right side, and 2+ on the left side.      Dorsalis pedis pulses are 2+ on the right side, and 2+ on the left side.  Posterior tibial pulses are 2+ on the right side, and 2+ on the left side.  Pulmonary/Chest: Effort normal and breath sounds normal. No respiratory distress.  Abdominal: Soft. Bowel sounds are normal. He exhibits no pulsatile midline mass. There is no tenderness. There is no rigidity, no rebound and no CVA tenderness.  Musculoskeletal:  Posterior and appearance appears normal. No evidence of obvious scoliosis or kyphosis. No obvious signs of skin changes, trauma, deformity, infection. No C, T, or L spine tenderness or step-offs to palpation. No C, T,  or L paraspinal tenderness.  There is tenderness of the left lower gluteus and upper left hamstring.  No bony tenderness to the left hip.  Negative logroll test.  No sacral crepitus.  Lung expansion normal. Bilateral lower extremity strength appropriate. Patellar and Achilles deep tendon reflex 2+ and equal bilaterally. Sensation of lower extremities grossly intact.  Lower extremity compartments soft. PT and DP 2+ b/l. Cap refill <2 seconds.   Neurological: He is alert.  Skin: Skin is warm, dry and intact. Capillary refill takes less than 2 seconds. No rash noted. He is not diaphoretic. No erythema.  Nursing note and vitals reviewed.    ED Treatments / Results  Labs (all labs ordered are listed, but only abnormal results are displayed) Labs Reviewed - No data to display  EKG  EKG Interpretation None       Radiology No results found.  Procedures Procedures (including critical care time)  Medications Ordered in ED Medications - No data to display   Initial Impression / Assessment and Plan / ED Course  I have reviewed the triage vital signs and the nursing notes.  Pertinent labs & imaging results that were available during my care of the patient were reviewed by me and considered in my medical decision making (see chart for details).     42 year old male with L5-S1 disc herniation with left-sided sciatica diagnosed by MRI (unable to visualize these myself in epic system) currently followed by Dr. Christella Noa surgery for tomorrow morning presenting for pain control.  Patient is without any bowel/bladder incontinence, saddle anesthesia, urinary retention.  No focal neurologic deficits on exam.  Do not suspect cauda equina.  He is without fever in the department.  No abdominal tenderness palpation.  No urinary symptoms reported.  Prior to me seeing the patient, nurse [place] paged Dr. Christella Noa who agreed to see the patient in the department.  Dr. Christella Noa to admit the patient. Patient appears  safe for admission.   Final Clinical Impressions(s) / ED Diagnoses   Final diagnoses:  Chronic left-sided low back pain with left-sided sciatica    ED Discharge Orders    None       Lorelle Gibbs 12/21/17 2147    Isla Pence, MD 12/21/17 2152

## 2017-12-21 NOTE — ED Triage Notes (Signed)
Pt here with back pain , pt is due to have surgery tomorrow morning by Dr Loyola Mast pt had a tordal shot and Neurontin before arrival

## 2017-12-22 ENCOUNTER — Other Ambulatory Visit: Payer: Self-pay

## 2017-12-22 ENCOUNTER — Observation Stay (HOSPITAL_COMMUNITY): Payer: 59 | Admitting: Certified Registered Nurse Anesthetist

## 2017-12-22 ENCOUNTER — Encounter (HOSPITAL_COMMUNITY)
Admission: EM | Disposition: A | Discharge status: Home / Self Care | Payer: Self-pay | Source: Home / Self Care | Attending: Emergency Medicine

## 2017-12-22 ENCOUNTER — Observation Stay (HOSPITAL_COMMUNITY): Payer: 59

## 2017-12-22 ENCOUNTER — Encounter (HOSPITAL_COMMUNITY): Payer: Self-pay | Admitting: Certified Registered Nurse Anesthetist

## 2017-12-22 DIAGNOSIS — M5117 Intervertebral disc disorders with radiculopathy, lumbosacral region: Secondary | ICD-10-CM | POA: Diagnosis not present

## 2017-12-22 DIAGNOSIS — R531 Weakness: Secondary | ICD-10-CM | POA: Diagnosis not present

## 2017-12-22 DIAGNOSIS — G8929 Other chronic pain: Secondary | ICD-10-CM | POA: Diagnosis not present

## 2017-12-22 DIAGNOSIS — R29818 Other symptoms and signs involving the nervous system: Secondary | ICD-10-CM | POA: Diagnosis not present

## 2017-12-22 HISTORY — PX: LUMBAR LAMINECTOMY/DECOMPRESSION MICRODISCECTOMY: SHX5026

## 2017-12-22 LAB — APTT: APTT: 32 s (ref 24–36)

## 2017-12-22 LAB — COMPREHENSIVE METABOLIC PANEL
ALBUMIN: 3.8 g/dL (ref 3.5–5.0)
ALT: 19 U/L (ref 17–63)
AST: 39 U/L (ref 15–41)
Alkaline Phosphatase: 66 U/L (ref 38–126)
Anion gap: 11 (ref 5–15)
BUN: 13 mg/dL (ref 6–20)
CHLORIDE: 105 mmol/L (ref 101–111)
CO2: 23 mmol/L (ref 22–32)
CREATININE: 0.98 mg/dL (ref 0.61–1.24)
Calcium: 9.2 mg/dL (ref 8.9–10.3)
GFR calc Af Amer: >60 mL/min (ref >60)
GFR calc non Af Amer: >60 mL/min (ref >60)
GLUCOSE: 112 mg/dL — AB (ref 65–99)
POTASSIUM: 3.6 mmol/L (ref 3.5–5.1)
SODIUM: 139 mmol/L (ref 135–145)
Total Bilirubin: 1 mg/dL (ref 0.3–1.2)
Total Protein: 6.2 g/dL — ABNORMAL LOW (ref 6.5–8.1)

## 2017-12-22 LAB — PROTIME-INR
INR: 1.11
Prothrombin Time: 14.2 seconds (ref 11.4–15.2)

## 2017-12-22 LAB — SURGICAL PCR SCREEN
MRSA, PCR: NEGATIVE
Staphylococcus aureus: POSITIVE — AB

## 2017-12-22 SURGERY — LUMBAR LAMINECTOMY/DECOMPRESSION MICRODISCECTOMY 1 LEVEL
Anesthesia: General | Site: Back | Laterality: Left

## 2017-12-22 MED ORDER — CELECOXIB 200 MG PO CAPS
200.0000 mg | ORAL_CAPSULE | Freq: Two times a day (BID) | ORAL | Status: DC
  Administered 2017-12-22: 200 mg via ORAL
  Filled 2017-12-22: qty 1

## 2017-12-22 MED ORDER — ACETAMINOPHEN 650 MG RE SUPP: 650.0000 mg | RECTAL | Status: DC | PRN

## 2017-12-22 MED ORDER — THROMBIN 5000 UNITS EX SOLR
CUTANEOUS | Status: AC
  Filled 2017-12-22: qty 10000

## 2017-12-22 MED ORDER — POTASSIUM CHLORIDE IN NACL 20-0.9 MEQ/L-% IV SOLN: INTRAVENOUS | Status: DC

## 2017-12-22 MED ORDER — ONDANSETRON HCL 4 MG PO TABS: 4.0000 mg | ORAL_TABLET | Freq: Four times a day (QID) | ORAL | Status: DC | PRN

## 2017-12-22 MED ORDER — LIDOCAINE-EPINEPHRINE 0.5 %-1:200000 IJ SOLN
INTRAMUSCULAR | Status: AC
  Filled 2017-12-22: qty 1

## 2017-12-22 MED ORDER — MIDAZOLAM HCL 2 MG/2ML IJ SOLN
INTRAMUSCULAR | Status: AC
  Filled 2017-12-22: qty 2

## 2017-12-22 MED ORDER — MAGNESIUM CITRATE PO SOLN: 1.0000 | Freq: Once | ORAL | Status: DC | PRN

## 2017-12-22 MED ORDER — KETOROLAC TROMETHAMINE 15 MG/ML IJ SOLN
15.0000 mg | Freq: Four times a day (QID) | INTRAMUSCULAR | Status: DC
  Administered 2017-12-22: 15 mg via INTRAVENOUS
  Filled 2017-12-22: qty 1

## 2017-12-22 MED ORDER — CEFAZOLIN SODIUM-DEXTROSE 2-3 GM-%(50ML) IV SOLR
INTRAVENOUS | Status: DC | PRN
  Administered 2017-12-22: 2 g via INTRAVENOUS

## 2017-12-22 MED ORDER — 0.9 % SODIUM CHLORIDE (POUR BTL) OPTIME
TOPICAL | Status: DC | PRN
  Administered 2017-12-22: 1000 mL

## 2017-12-22 MED ORDER — THROMBIN 5000 UNITS EX SOLR
CUTANEOUS | Status: DC | PRN
  Administered 2017-12-22 (×2): 5000 [IU] via TOPICAL

## 2017-12-22 MED ORDER — ACETAMINOPHEN 500 MG PO TABS: 500.0000 mg | ORAL_TABLET | Freq: Four times a day (QID) | ORAL | Status: DC | PRN

## 2017-12-22 MED ORDER — PHENOL 1.4 % MT LIQD: 1.0000 | OROMUCOSAL | Status: DC | PRN

## 2017-12-22 MED ORDER — DEXAMETHASONE SODIUM PHOSPHATE 10 MG/ML IJ SOLN
INTRAMUSCULAR | Status: AC
  Filled 2017-12-22: qty 1

## 2017-12-22 MED ORDER — ACETAMINOPHEN 10 MG/ML IV SOLN
INTRAVENOUS | Status: AC
  Filled 2017-12-22: qty 100

## 2017-12-22 MED ORDER — MAGNESIUM CITRATE PO SOLN
1.0000 | Freq: Once | ORAL | Status: AC
  Administered 2017-12-22: 1 via ORAL
  Filled 2017-12-22: qty 296

## 2017-12-22 MED ORDER — SODIUM CHLORIDE 0.9% FLUSH: 3.0000 mL | INTRAVENOUS | Status: DC | PRN

## 2017-12-22 MED ORDER — METHYLPREDNISOLONE ACETATE 80 MG/ML IJ SUSP
INTRAMUSCULAR | Status: AC
  Filled 2017-12-22: qty 1

## 2017-12-22 MED ORDER — TIZANIDINE HCL 4 MG PO TABS: 4.0000 mg | ORAL_TABLET | Freq: Four times a day (QID) | ORAL | 0 refills | Status: DC | PRN

## 2017-12-22 MED ORDER — PHENYLEPHRINE 40 MCG/ML (10ML) SYRINGE FOR IV PUSH (FOR BLOOD PRESSURE SUPPORT)
PREFILLED_SYRINGE | INTRAVENOUS | Status: DC | PRN
Start: 2017-12-22 — End: 2017-12-22
  Administered 2017-12-22 (×4): 80 ug via INTRAVENOUS

## 2017-12-22 MED ORDER — BISACODYL 5 MG PO TBEC
5.0000 mg | DELAYED_RELEASE_TABLET | Freq: Every day | ORAL | Status: DC | PRN
Start: 2017-12-22 — End: 2017-12-22

## 2017-12-22 MED ORDER — HYDROMORPHONE HCL 1 MG/ML IJ SOLN: 0.2500 mg | INTRAMUSCULAR | Status: DC | PRN

## 2017-12-22 MED ORDER — SODIUM CHLORIDE 0.9 % IV SOLN: 250.0000 mL | INTRAVENOUS | Status: DC

## 2017-12-22 MED ORDER — HEMOSTATIC AGENTS (NO CHARGE) OPTIME
TOPICAL | Status: DC | PRN
  Administered 2017-12-22: 1 via TOPICAL

## 2017-12-22 MED ORDER — BUPIVACAINE HCL (PF) 0.5 % IJ SOLN
INTRAMUSCULAR | Status: DC | PRN
  Administered 2017-12-22: 30 mL

## 2017-12-22 MED ORDER — LIDOCAINE 2% (20 MG/ML) 5 ML SYRINGE
INTRAMUSCULAR | Status: AC
  Filled 2017-12-22: qty 5

## 2017-12-22 MED ORDER — SUGAMMADEX SODIUM 200 MG/2ML IV SOLN
INTRAVENOUS | Status: AC
  Filled 2017-12-22: qty 2

## 2017-12-22 MED ORDER — SODIUM CHLORIDE 0.9% FLUSH: 3.0000 mL | Freq: Two times a day (BID) | INTRAVENOUS | Status: DC

## 2017-12-22 MED ORDER — MIDAZOLAM HCL 5 MG/5ML IJ SOLN
INTRAMUSCULAR | Status: DC | PRN
  Administered 2017-12-22: 2 mg via INTRAVENOUS

## 2017-12-22 MED ORDER — KETOROLAC TROMETHAMINE 30 MG/ML IJ SOLN
INTRAMUSCULAR | Status: DC | PRN
  Administered 2017-12-22: 30 mg via INTRAVENOUS

## 2017-12-22 MED ORDER — MENTHOL 3 MG MT LOZG: 1.0000 | LOZENGE | OROMUCOSAL | Status: DC | PRN

## 2017-12-22 MED ORDER — ZOLPIDEM TARTRATE 5 MG PO TABS: 5.0000 mg | ORAL_TABLET | Freq: Every evening | ORAL | Status: DC | PRN

## 2017-12-22 MED ORDER — ONDANSETRON HCL 4 MG/2ML IJ SOLN: 4.0000 mg | Freq: Four times a day (QID) | INTRAMUSCULAR | Status: DC | PRN

## 2017-12-22 MED ORDER — SENNOSIDES-DOCUSATE SODIUM 8.6-50 MG PO TABS: 1.0000 | ORAL_TABLET | Freq: Every evening | ORAL | Status: DC | PRN

## 2017-12-22 MED ORDER — ONDANSETRON HCL 4 MG/2ML IJ SOLN
INTRAMUSCULAR | Status: AC
  Filled 2017-12-22: qty 2

## 2017-12-22 MED ORDER — MEPERIDINE HCL 50 MG/ML IJ SOLN: 6.2500 mg | INTRAMUSCULAR | Status: DC | PRN

## 2017-12-22 MED ORDER — PANTOPRAZOLE SODIUM 40 MG PO TBEC: 40.0000 mg | DELAYED_RELEASE_TABLET | Freq: Two times a day (BID) | ORAL | Status: DC

## 2017-12-22 MED ORDER — FENTANYL CITRATE (PF) 100 MCG/2ML IJ SOLN
INTRAMUSCULAR | Status: DC | PRN
  Administered 2017-12-22: 150 ug via INTRAVENOUS
  Administered 2017-12-22: 50 ug via INTRAVENOUS

## 2017-12-22 MED ORDER — DOCUSATE SODIUM 100 MG PO CAPS
100.0000 mg | ORAL_CAPSULE | Freq: Two times a day (BID) | ORAL | Status: DC
  Administered 2017-12-22: 100 mg via ORAL
  Filled 2017-12-22: qty 1

## 2017-12-22 MED ORDER — PROMETHAZINE HCL 25 MG/ML IJ SOLN: 6.2500 mg | INTRAMUSCULAR | Status: DC | PRN

## 2017-12-22 MED ORDER — ACETAMINOPHEN 325 MG PO TABS: 650.0000 mg | ORAL_TABLET | ORAL | Status: DC | PRN

## 2017-12-22 MED ORDER — BUPIVACAINE HCL (PF) 0.5 % IJ SOLN
INTRAMUSCULAR | Status: AC
  Filled 2017-12-22: qty 30

## 2017-12-22 MED ORDER — DEXAMETHASONE SODIUM PHOSPHATE 10 MG/ML IJ SOLN
INTRAMUSCULAR | Status: DC | PRN
  Administered 2017-12-22: 10 mg via INTRAVENOUS

## 2017-12-22 MED ORDER — LIDOCAINE-EPINEPHRINE 0.5 %-1:200000 IJ SOLN
INTRAMUSCULAR | Status: DC | PRN
  Administered 2017-12-22: 10 mL

## 2017-12-22 MED ORDER — ONDANSETRON HCL 4 MG/2ML IJ SOLN
INTRAMUSCULAR | Status: DC | PRN
  Administered 2017-12-22: 4 mg via INTRAVENOUS

## 2017-12-22 MED ORDER — ACETAMINOPHEN-CODEINE #3 300-30 MG PO TABS: 1.0000 | ORAL_TABLET | Freq: Two times a day (BID) | ORAL | Status: DC | PRN

## 2017-12-22 MED ORDER — FENTANYL CITRATE (PF) 100 MCG/2ML IJ SOLN
INTRAMUSCULAR | Status: AC
  Filled 2017-12-22: qty 2

## 2017-12-22 MED ORDER — DIAZEPAM 5 MG PO TABS: 5.0000 mg | ORAL_TABLET | Freq: Four times a day (QID) | ORAL | Status: DC | PRN

## 2017-12-22 MED ORDER — ROCURONIUM BROMIDE 10 MG/ML (PF) SYRINGE
PREFILLED_SYRINGE | INTRAVENOUS | Status: AC
  Filled 2017-12-22: qty 5

## 2017-12-22 MED ORDER — LACTATED RINGERS IV SOLN
INTRAVENOUS | Status: DC | PRN
  Administered 2017-12-22: 07:00:00 via INTRAVENOUS

## 2017-12-22 MED ORDER — PROPOFOL 10 MG/ML IV BOLUS
INTRAVENOUS | Status: DC | PRN
  Administered 2017-12-22: 250 mg via INTRAVENOUS

## 2017-12-22 MED ORDER — ROCURONIUM BROMIDE 100 MG/10ML IV SOLN
INTRAVENOUS | Status: DC | PRN
  Administered 2017-12-22: 10 mg via INTRAVENOUS
  Administered 2017-12-22: 50 mg via INTRAVENOUS

## 2017-12-22 MED ORDER — LIDOCAINE HCL (CARDIAC) 20 MG/ML IV SOLN
INTRAVENOUS | Status: DC | PRN
  Administered 2017-12-22: 40 mg via INTRAVENOUS

## 2017-12-22 MED ORDER — METHYLPREDNISOLONE ACETATE 80 MG/ML IJ SUSP
INTRAMUSCULAR | Status: DC | PRN
  Administered 2017-12-22: 80 mg

## 2017-12-22 MED ORDER — OXYCODONE HCL 5 MG PO TABS: 10.0000 mg | ORAL_TABLET | ORAL | Status: DC | PRN

## 2017-12-22 MED ORDER — PROPOFOL 10 MG/ML IV BOLUS
INTRAVENOUS | Status: AC
  Filled 2017-12-22: qty 40

## 2017-12-22 MED ORDER — ADULT MULTIVITAMIN W/MINERALS CH
1.0000 | ORAL_TABLET | Freq: Every day | ORAL | Status: DC
  Administered 2017-12-22: 1 via ORAL
  Filled 2017-12-22: qty 1

## 2017-12-22 MED ORDER — ACETAMINOPHEN 10 MG/ML IV SOLN
INTRAVENOUS | Status: DC | PRN
  Administered 2017-12-22: 1000 mg via INTRAVENOUS

## 2017-12-22 MED ORDER — FENTANYL CITRATE (PF) 100 MCG/2ML IJ SOLN
INTRAMUSCULAR | Status: DC | PRN
  Administered 2017-12-22: 100 ug via INTRAVENOUS

## 2017-12-22 MED ORDER — NAPROXEN-ESOMEPRAZOLE 500-20 MG PO TBEC: 1.0000 | DELAYED_RELEASE_TABLET | Freq: Two times a day (BID) | ORAL | Status: DC

## 2017-12-22 MED ORDER — MIDAZOLAM HCL 2 MG/2ML IJ SOLN: 0.5000 mg | Freq: Once | INTRAMUSCULAR | Status: DC | PRN

## 2017-12-22 MED ORDER — SUGAMMADEX SODIUM 200 MG/2ML IV SOLN
INTRAVENOUS | Status: DC | PRN
  Administered 2017-12-22: 200 mg via INTRAVENOUS

## 2017-12-22 MED ORDER — OXYCODONE HCL 5 MG PO TABS: 5.0000 mg | ORAL_TABLET | ORAL | Status: DC | PRN

## 2017-12-22 MED ORDER — OXYCODONE HCL ER 10 MG PO T12A
10.0000 mg | EXTENDED_RELEASE_TABLET | Freq: Two times a day (BID) | ORAL | Status: DC
  Administered 2017-12-22: 10 mg via ORAL
  Filled 2017-12-22: qty 1

## 2017-12-22 MED ORDER — TRAMADOL HCL 50 MG PO TABS: 50.0000 mg | ORAL_TABLET | Freq: Four times a day (QID) | ORAL | 0 refills | Status: DC | PRN

## 2017-12-22 MED ORDER — FENTANYL CITRATE (PF) 250 MCG/5ML IJ SOLN
INTRAMUSCULAR | Status: AC
  Filled 2017-12-22: qty 5

## 2017-12-22 MED ORDER — PHENYLEPHRINE 40 MCG/ML (10ML) SYRINGE FOR IV PUSH (FOR BLOOD PRESSURE SUPPORT)
PREFILLED_SYRINGE | INTRAVENOUS | Status: AC
  Filled 2017-12-22: qty 10

## 2017-12-22 MED ORDER — KETOROLAC TROMETHAMINE 30 MG/ML IJ SOLN
INTRAMUSCULAR | Status: AC
  Filled 2017-12-22: qty 1

## 2017-12-22 SURGICAL SUPPLY — 61 items
ADH SKN CLS APL DERMABOND .7 (GAUZE/BANDAGES/DRESSINGS) ×1
APL SKNCLS STERI-STRIP NONHPOA (GAUZE/BANDAGES/DRESSINGS)
BAG DECANTER FOR FLEXI CONT (MISCELLANEOUS) ×1 IMPLANT
BENZOIN TINCTURE PRP APPL 2/3 (GAUZE/BANDAGES/DRESSINGS) IMPLANT
BLADE CLIPPER SURG (BLADE) IMPLANT
BUR MATCHSTICK NEURO 3.0 LAGG (BURR) ×2 IMPLANT
BUR PRECISION FLUTE 5.0 (BURR) ×1 IMPLANT
CANISTER SUCT 3000ML PPV (MISCELLANEOUS) ×2 IMPLANT
CARTRIDGE OIL MAESTRO DRILL (MISCELLANEOUS) ×1 IMPLANT
DECANTER SPIKE VIAL GLASS SM (MISCELLANEOUS) ×2 IMPLANT
DERMABOND ADVANCED (GAUZE/BANDAGES/DRESSINGS) ×1
DERMABOND ADVANCED .7 DNX12 (GAUZE/BANDAGES/DRESSINGS) ×1 IMPLANT
DIFFUSER DRILL AIR PNEUMATIC (MISCELLANEOUS) ×2 IMPLANT
DRAPE LAPAROTOMY 100X72X124 (DRAPES) ×2 IMPLANT
DRAPE MICROSCOPE LEICA (MISCELLANEOUS) ×2 IMPLANT
DRAPE POUCH INSTRU U-SHP 10X18 (DRAPES) ×2 IMPLANT
DRAPE SURG 17X23 STRL (DRAPES) ×2 IMPLANT
DURAPREP 26ML APPLICATOR (WOUND CARE) ×2 IMPLANT
ELECT REM PT RETURN 9FT ADLT (ELECTROSURGICAL) ×2
ELECTRODE REM PT RTRN 9FT ADLT (ELECTROSURGICAL) ×1 IMPLANT
GAUZE SPONGE 4X4 12PLY STRL (GAUZE/BANDAGES/DRESSINGS) IMPLANT
GAUZE SPONGE 4X4 16PLY XRAY LF (GAUZE/BANDAGES/DRESSINGS) IMPLANT
GLOVE BIO SURGEON STRL SZ 6.5 (GLOVE) ×2 IMPLANT
GLOVE BIOGEL PI IND STRL 6.5 (GLOVE) IMPLANT
GLOVE BIOGEL PI IND STRL 8 (GLOVE) IMPLANT
GLOVE BIOGEL PI INDICATOR 6.5 (GLOVE) ×1
GLOVE BIOGEL PI INDICATOR 8 (GLOVE) ×1
GLOVE ECLIPSE 6.5 STRL STRAW (GLOVE) ×2 IMPLANT
GLOVE ECLIPSE 7.5 STRL STRAW (GLOVE) ×1 IMPLANT
GLOVE EXAM NITRILE LRG STRL (GLOVE) IMPLANT
GLOVE EXAM NITRILE XL STR (GLOVE) IMPLANT
GLOVE EXAM NITRILE XS STR PU (GLOVE) IMPLANT
GOWN STRL REUS W/ TWL LRG LVL3 (GOWN DISPOSABLE) ×2 IMPLANT
GOWN STRL REUS W/ TWL XL LVL3 (GOWN DISPOSABLE) IMPLANT
GOWN STRL REUS W/TWL 2XL LVL3 (GOWN DISPOSABLE) IMPLANT
GOWN STRL REUS W/TWL LRG LVL3 (GOWN DISPOSABLE) ×4
GOWN STRL REUS W/TWL XL LVL3 (GOWN DISPOSABLE) ×2
KIT BASIN OR (CUSTOM PROCEDURE TRAY) ×2 IMPLANT
KIT ROOM TURNOVER OR (KITS) ×2 IMPLANT
NDL HYPO 18GX1.5 BLUNT FILL (NEEDLE) IMPLANT
NDL HYPO 25X1 1.5 SAFETY (NEEDLE) ×1 IMPLANT
NDL SPNL 18GX3.5 QUINCKE PK (NEEDLE) IMPLANT
NEEDLE HYPO 18GX1.5 BLUNT FILL (NEEDLE) ×2 IMPLANT
NEEDLE HYPO 25X1 1.5 SAFETY (NEEDLE) ×2 IMPLANT
NEEDLE SPNL 18GX3.5 QUINCKE PK (NEEDLE) ×2 IMPLANT
NS IRRIG 1000ML POUR BTL (IV SOLUTION) ×2 IMPLANT
OIL CARTRIDGE MAESTRO DRILL (MISCELLANEOUS) ×2
PACK LAMINECTOMY NEURO (CUSTOM PROCEDURE TRAY) ×2 IMPLANT
PAD ARMBOARD 7.5X6 YLW CONV (MISCELLANEOUS) ×6 IMPLANT
RUBBERBAND STERILE (MISCELLANEOUS) ×4 IMPLANT
SPONGE LAP 4X18 X RAY DECT (DISPOSABLE) IMPLANT
SPONGE SURGIFOAM ABS GEL SZ50 (HEMOSTASIS) ×2 IMPLANT
STRIP CLOSURE SKIN 1/2X4 (GAUZE/BANDAGES/DRESSINGS) IMPLANT
SUT VIC AB 0 CT1 18XCR BRD8 (SUTURE) ×1 IMPLANT
SUT VIC AB 0 CT1 8-18 (SUTURE) ×2
SUT VIC AB 2-0 CT1 18 (SUTURE) ×2 IMPLANT
SUT VIC AB 3-0 SH 8-18 (SUTURE) ×2 IMPLANT
SYR 5ML LL (SYRINGE) ×1 IMPLANT
TOWEL GREEN STERILE (TOWEL DISPOSABLE) ×2 IMPLANT
TOWEL GREEN STERILE FF (TOWEL DISPOSABLE) ×2 IMPLANT
WATER STERILE IRR 1000ML POUR (IV SOLUTION) ×2 IMPLANT

## 2017-12-22 NOTE — Discharge Summary (Signed)
Physician Discharge Summary  Patient ID: Patrick Clayton MRN: [id number] DOB/AGE: 03/31/1976 42 y.o.  Admit date: 12/21/2017 Discharge date: 12/22/2017  Admission Diagnoses:HNP left L5/S1  Discharge Diagnoses:  Active Problems:   HNP (herniated nucleus pulposus), lumbar   Discharged Condition: good  Hospital Course: [name] was admitted via the ED secondary to severe unrelenting pain in his left lower extremity. MRI revealed a large displaced disc on the left at L5/S1. I took him to the OR today and he had an uncomplicated lumbar discetomy. Post op he is ambulating, voiding, and tolerating a regular diet. His wound is clean, dry, and without signs of infection.   Treatments: surgery: Left L5/S1 discetomy  Discharge Exam: Blood pressure 118/83, pulse 74, temperature 97.7 F (36.5 C), resp. rate 18, height 5\' 10"  (1.778 m), weight 82.7 kg (182 lb 5.1 oz), SpO2 100 %. General appearance: alert, cooperative and no distress Neurologic: Alert and oriented X 3, normal strength and tone. Normal symmetric reflexes. Normal coordination and gait  Disposition: 01-Home or Self Care herniated disc Lumbar five-Sacral one Discharge Instructions    Call MD for:  persistant nausea and vomiting   Complete by:  As directed    Call MD for:  redness, tenderness, or signs of infection (pain, swelling, redness, odor or green/yellow discharge around incision site)   Complete by:  As directed    Call MD for:  severe uncontrolled pain   Complete by:  As directed    Call MD for:  temperature >100.4   Complete by:  As directed    Discharge instructions   Complete by:  As directed    Lumbar Discectomy Care After A discectomy involves removal of discmaterial (the cartilage-like structures located between the bones of the back). It is done to relieve pressure on nerve roots. It can be used as a treatment for a back problem. The time in surgery depends on the findings in surgery and what is necessary to  correct the problems. HOME CARE INSTRUCTIONS  Check the cut (incision) made by the surgeon twice a day for signs of infection. Some signs of infection may include:  A foul smelling, greenish or yellowish discharge from the wound.  Increased pain.  Increased redness over the incision (operative) site.  The skin edges may separate.  Flu-like symptoms (problems).  A temperature above 101.5 F (38.6 C).  Change your bandages in about 24 to 36 hours following surgery or as directed.  You may shower tomrrow.  Avoid bathtubs, swimming pools and hot tubs for three weeks or until your incision has healed completely. Follow your doctor's instructions as to safe activities, exercises, and physical therapy.  Weight reduction may be beneficial if you are overweight.  Daily exercise is helpful to prevent the return of problems. Walking is permitted. You may use a treadmill without an incline. Cut down on activities and exercise if you have discomfort. You may also go up and down stairs as much as you can tolerate.  DO NOT lift anything heavier than 10 to 15 lbs. Avoid bending or twisting at the waist. Always bend your knees when lifting.  Maintain strength and range of motion as instructed.  Do not drive for 10 days, or as directed by your doctors. You may be a passenger . Lying back in the passenger seat may be more comfortable for you. Always wear a seatbelt.  Limit your sitting in a regular chair to 20 to 30 minutes at a time. There are no limitations for  sitting in a recliner. You should lie down or walk in between sitting periods.  Only take over-the-counter or prescription medicines for pain, discomfort, or fever as directed by your caregiver.  SEEK MEDICAL CARE IF:  There is increased bleeding (more than a small spot) from the wound.  You notice redness, swelling, or increasing pain in the wound.  Pus is coming from wound.  You develop an unexplained oral temperature above 102 F (38.9 C) develops.   You notice a foul smell coming from the wound or dressing.  You have increasing pain in your wound.  SEEK IMMEDIATE MEDICAL CARE IF:  You develop a rash.  You have difficulty breathing.  You develop any allergic problems to medicines given.  Document Released: 10/06/2004 Document Revised: 10/21/2011 Document Reviewed: 01/25/2008 ExitCare Patient Information   Discharge wound care:   Complete by:  As directed    You may shower tomorrow   Driving Restrictions   Complete by:  As directed    No driving for 1 week   Increase activity slowly   Complete by:  As directed    Lifting restrictions   Complete by:  As directed    Nothing greater than 5lbs   Sexual Activity Restrictions   Complete by:  As directed    You may have sex, you should either be on your side, or lying on a flat surface     Allergies as of 12/22/2017   No Known Allergies     Medication List    STOP taking these medications   acetaminophen-codeine 300-30 MG tablet Commonly known as:  TYLENOL #3   cyclobenzaprine 5 MG tablet Commonly known as:  FLEXERIL     TAKE these medications   acetaminophen 500 MG tablet Commonly known as:  TYLENOL Take 500-1,000 mg by mouth every 6 (six) hours as needed (for pain).   gabapentin 300 MG capsule Commonly known as:  NEURONTIN Take as directed to equal 900mg  daily What changed:    how much to take  how to take this  when to take this  reasons to take this  additional instructions   methocarbamol 500 MG tablet Commonly known as:  ROBAXIN Take 500 mg by mouth See admin instructions. 500 mg by mouth every 6-8 hours as needed for muscle spasms during the DAYTIME   multivitamin Tabs tablet Take 1 tablet by mouth daily.   tiZANidine 4 MG tablet Commonly known as:  ZANAFLEX Take 1 tablet (4 mg total) by mouth every 6 (six) hours as needed for muscle spasms.   traMADol 50 MG tablet Commonly known as:  ULTRAM Take 1 tablet (50 mg total) by mouth every 6 (six)  hours as needed.   VIMOVO 500-20 MG Tbec Generic drug:  Naproxen-Esomeprazole Take 1 tablet by mouth 2 (two) times daily.            Discharge Care Instructions  (From admission, onward)        Start     Ordered   12/22/17 0000  Discharge wound care:    Comments:  You may shower tomorrow   12/22/17 1538     Follow-up Information    Ashok Pall, MD Follow up in 3 week(s).   Specialty:  Neurosurgery Why:  please call to make an appointment Contact information: [address] [place] [address] [phone]           Signed: Winfield Cunas 12/22/2017, 3:38 PM

## 2017-12-22 NOTE — Progress Notes (Signed)
Patient is discharged from room 3C03 at this time. Alert and in stable condition. IV site d/c'[d and instructions read to patient and spouse with understanding verbalized. Left unit via wheelchair with all belongings at side. 

## 2017-12-22 NOTE — Transfer of Care (Signed)
Immediate Anesthesia Transfer of Care Note  Patient: Patrick Clayton  Procedure(s) Performed: LEFT LUMBAR FIVE-SACRAL ONE  LAMINECTOMY and DISCECTOMY (Left Back)  Patient Location: PACU  Anesthesia Type:General  Level of Consciousness: awake, alert  and oriented  Airway & Oxygen Therapy: Patient Spontanous Breathing and Patient connected to nasal cannula oxygen  Post-op Assessment: Report given to RN and Post -op Vital signs reviewed and stable  Post vital signs: Reviewed and stable  Last Vitals:  Vitals:   12/22/17 0124 12/22/17 0400  BP: 124/81 100/64  Pulse: 64 66  Resp: 18 20  Temp: 36.5 C 37 C  SpO2: 100% 99%    Last Pain:  Vitals:   12/22/17 0400  TempSrc: Oral  PainSc:       Patients Stated Pain Goal: 0 (20/80/22 3361)  Complications: No apparent anesthesia complications

## 2017-12-22 NOTE — Anesthesia Procedure Notes (Signed)
Procedure Name: Intubation Date/Time: 12/22/2017 7:37 AM Performed by: Candis Shine, CRNA Pre-anesthesia Checklist: Patient identified, Emergency Drugs available, Suction available and Patient being monitored Patient Re-evaluated:Patient Re-evaluated prior to induction Oxygen Delivery Method: Circle System Utilized Preoxygenation: Pre-oxygenation with 100% oxygen Induction Type: IV induction Ventilation: Mask ventilation without difficulty Laryngoscope Size: Mac and 4 Grade View: Grade I Tube type: Oral Tube size: 7.5 mm Number of attempts: 1 Airway Equipment and Method: Stylet Placement Confirmation: ETT inserted through vocal cords under direct vision,  positive ETCO2 and breath sounds checked- equal and bilateral Secured at: 22 cm Tube secured with: Tape Dental Injury: Teeth and Oropharynx as per pre-operative assessment

## 2017-12-22 NOTE — ED Notes (Addendum)
Dr Cyndy Freeze at bedside  The pts wife went home

## 2017-12-22 NOTE — Anesthesia Postprocedure Evaluation (Signed)
Anesthesia Post Note  Patient: Eden Lathe  Procedure(s) Performed: LEFT LUMBAR FIVE-SACRAL ONE  LAMINECTOMY and DISCECTOMY (Left Back)     Patient location during evaluation: PACU Anesthesia Type: General Level of consciousness: awake and alert, oriented and patient cooperative Pain management: pain level controlled Vital Signs Assessment: post-procedure vital signs reviewed and stable Respiratory status: spontaneous breathing, nonlabored ventilation and respiratory function stable Cardiovascular status: blood pressure returned to baseline and stable Postop Assessment: no apparent nausea or vomiting Anesthetic complications: no    Last Vitals:  Vitals:   12/22/17 1000 12/22/17 1015  BP: 131/82 118/83  Pulse: 70 74  Resp: 13 18  Temp: 36.7 C 36.5 C  SpO2: 100% 100%    Last Pain:  Vitals:   12/22/17 1205  TempSrc:   PainSc: 4                  Adleigh Mcmasters,E. Rye Decoste

## 2017-12-22 NOTE — Progress Notes (Signed)
Wedding ring returned to pt

## 2017-12-22 NOTE — ED Notes (Signed)
The pt is here with back pain he was out walking in the past week and had a sudden onset of lower back pain  No previous history  He reports that he is due to have back surgery tomorrow  He was at doctors office today that gave him a shot and went him to the ed.  He reports that he was unable to sleep last pm for the pain in his back.  He had not seen dr cabell in the past  Hard to get a history from the pt alert oriented skin warm and dry moves all extremities

## 2017-12-22 NOTE — Anesthesia Preprocedure Evaluation (Signed)
Anesthesia Evaluation  Patient identified by MRN, date of birth, ID band Patient awake    Reviewed: Allergy & Precautions, NPO status , Patient's Chart, lab work & pertinent test results  History of Anesthesia Complications Negative for: history of anesthetic complications  Airway Mallampati: II  TM Distance: >3 FB Neck ROM: Full    Dental  (+) Dental Advisory Given   Pulmonary neg pulmonary ROS,    breath sounds clear to auscultation       Cardiovascular (-) anginanegative cardio ROS   Rhythm:Regular Rate:Normal     Neuro/Psych Chronic back pain    GI/Hepatic negative GI ROS, Neg liver ROS,   Endo/Other  negative endocrine ROS  Renal/GU negative Renal ROS     Musculoskeletal   Abdominal   Peds  Hematology negative hematology ROS (+)   Anesthesia Other Findings   Reproductive/Obstetrics                             Anesthesia Physical Anesthesia Plan  ASA: II  Anesthesia Plan: General   Post-op Pain Management:    Induction: Intravenous  PONV Risk Score and Plan: 3 and Ondansetron and Dexamethasone  Airway Management Planned: Oral ETT  Additional Equipment:   Intra-op Plan:   Post-operative Plan: Extubation in OR  Informed Consent: I have reviewed the patients History and Physical, chart, labs and discussed the procedure including the risks, benefits and alternatives for the proposed anesthesia with the patient or authorized representative who has indicated his/her understanding and acceptance.   Dental advisory given  Plan Discussed with: CRNA and Surgeon  Anesthesia Plan Comments: (Plan routine monitors, GETA)        Anesthesia Quick Evaluation

## 2017-12-22 NOTE — Op Note (Signed)
12/22/2017  9:01 AM  PATIENT:  Patrick Clayton  42 y.o. male  PRE-OPERATIVE DIAGNOSIS:  herniated disc Lumbar five-Sacral one  POST-OPERATIVE DIAGNOSIS:  herniated disc Lumbar five-Sacral one  PROCEDURE:  Procedure(s): LEFT LUMBAR FIVE-SACRAL ONE  LAMINECTOMY and DISCECTOMY  SURGEON:   Surgeon(s): Ashok Pall, MD  ASSISTANTS:none  ANESTHESIA:   general  EBL:  Total I/O In: 700 [I.V.:700] Out: 10 [Blood:10]  BLOOD ADMINISTERED:none  CELL SAVER GIVEN:none  COUNT:per nursing  DRAINS: none   SPECIMEN:  No Specimen  DICTATION: [name] was taken to the operating room, intubated and placed under a general anesthetic without difficulty. He was positioned prone on a Wilson frame with all pressure points padded. His back was prepped and draped in a sterile manner. I opened the skin with a 10 blade and carried the dissection down to the thoracolumbar fascia. I used both sharp dissection and the monopolar cautery to expose the lamina of L5, and S1. I confirmed my location with an intraoperative xray.  . I used the punches to remove the ligamentum flavum to expose the thecal sac. I brought the microscope into the operative field. I started the decompression of the spinal canal, thecal sac and S1 root(s). I cauterized epidural veins overlying the disc space then divided them sharply. I opened the disc space with a 15 blade and proceeded with the discectomy. I used pituitary rongeurs, curettes, and other instruments to remove disc material. After the discectomy was completed I inspected the S1 nerve root and felt it was well decompressed. I explored rostrally, laterally, medially, and caudally and was satisfied with the decompression. I irrigated the wound, then closed in layers. I approximated the thoracolumbar fascia, subcutaneous, and subcuticular planes with vicryl sutures. I used dermabond for a sterile dressing.   PLAN OF CARE: Admit for overnight observation  PATIENT  DISPOSITION:  PACU - hemodynamically stable.   Delay start of Pharmacological VTE agent (>24hrs) due to surgical blood loss or risk of bleeding:  yes

## 2017-12-23 ENCOUNTER — Encounter (HOSPITAL_COMMUNITY): Payer: Self-pay | Admitting: Neurosurgery

## 2018-05-06 IMAGING — CR DG LUMBAR SPINE 2-3V
2 series · 2 of 2 positions shown · non-contrast
Comparison: MRI November 17, 2017.

CLINICAL DATA: L5-S1 discectomy.

EXAM:
LUMBAR SPINE - 2-3 VIEW

[lateral (1 of 2)]
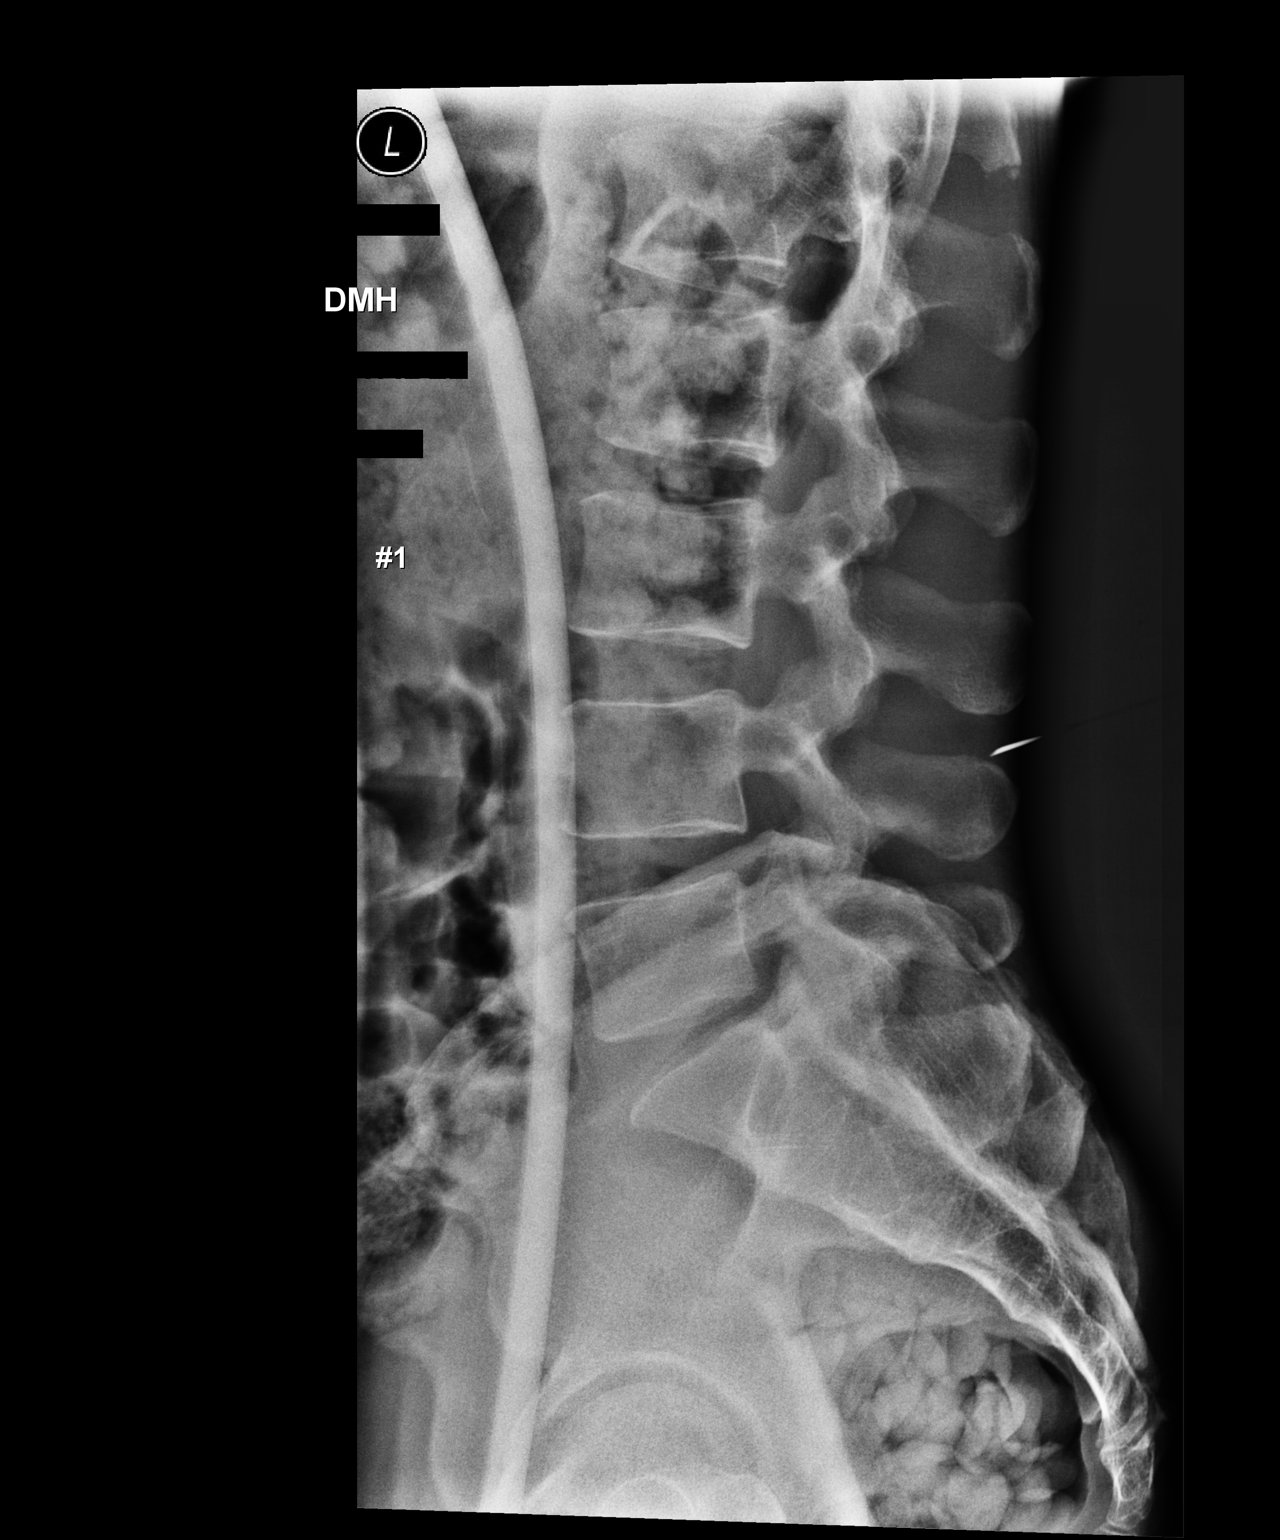

[lateral (2 of 2)]
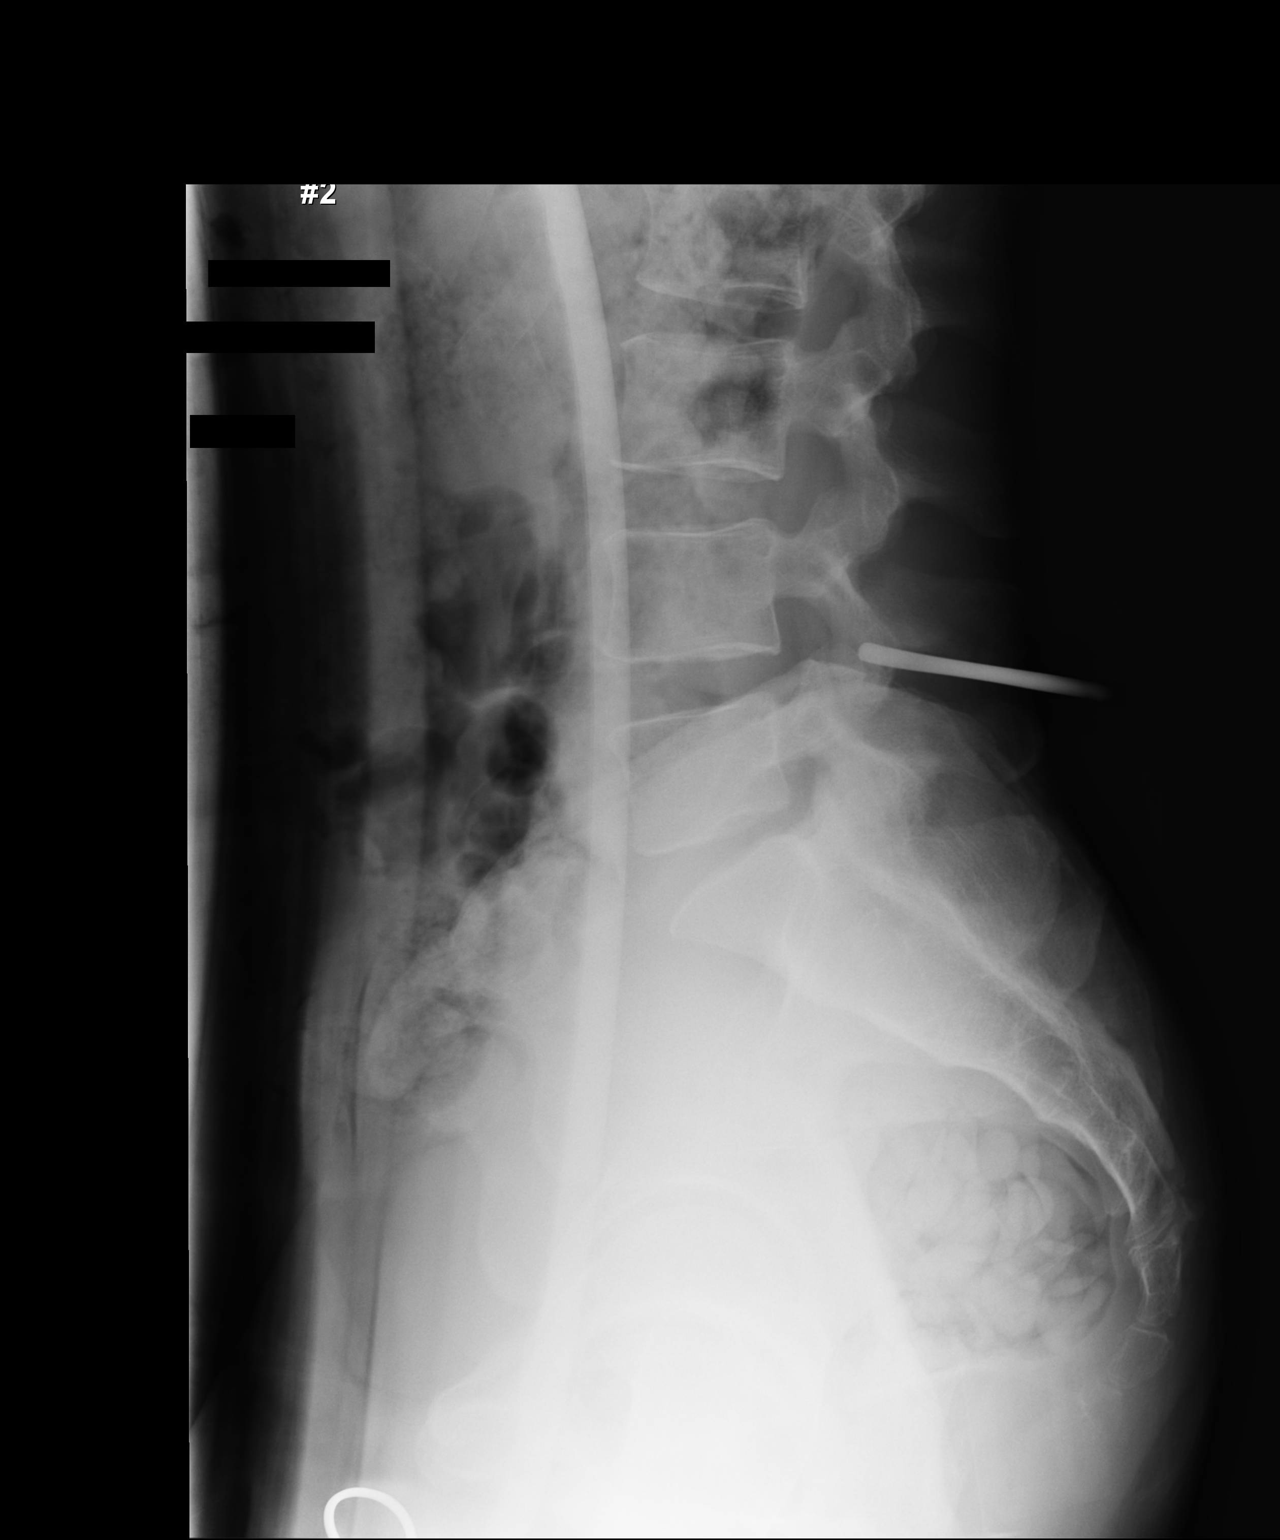

[2 of 2 positions shown; findings below may reference images not displayed]

FINDINGS: Two intraoperative cross-table lateral projections were obtained of
the lumbar spine. The first image demonstrates surgical probe
directed toward posterior spinous process of L4. The second image
demonstrates surgical probe directed toward posterior margin of L4-5
disc space.
IMPRESSION: Surgical localization as described above.

## 2018-09-27 ENCOUNTER — Encounter (INDEPENDENT_AMBULATORY_CARE_PROVIDER_SITE_OTHER): Payer: Self-pay | Admitting: Family Medicine

## 2018-09-27 ENCOUNTER — Ambulatory Visit (INDEPENDENT_AMBULATORY_CARE_PROVIDER_SITE_OTHER): Payer: 59 | Admitting: Family Medicine

## 2018-09-27 DIAGNOSIS — R200 Anesthesia of skin: Secondary | ICD-10-CM

## 2018-09-27 NOTE — Progress Notes (Signed)
Office Visit Note   Patient: Patrick Clayton           Date of Birth: [birth year]           MRN: [id number] Visit Date: 09/27/2018 Requested by: Maury Dus, MD [address] [place], [place] [address] PCP: Maury Dus, MD  Subjective: Chief Complaint  Patient presents with  . Left Foot - Numbness    Numbness primarily in the left foot with walking/running since back surgery 2/19.    HPI: He is a 42 year old seen at the request of Dr. Rebbeca Paul for evaluation of left leg numbness.  About January 2018 he was running up hill when he started feeling pain from the left proximal hamstring area down his leg.  Pain became progressively more severe and he eventually was diagnosed with an L5-S1 disc herniation by MRI scan.  This was managed conservatively with physical therapy and epidural steroid injection, but eventually he developed such severe pain that in January 2019 he underwent emergency surgery per Dr. Christella Noa.  Within 2 weeks he had immediate relief of his leg pain, but he was still having some numbness in his leg intermittently.  He began his training regimen of jogging, but he has never been able to get back to his previous exercise level.  He states that after about a mile he starts feeling a very tight cramping sensation in his calf and if he tries to run beyond that, his foot becomes numb.  The same thing happens with fast walking, but it takes much longer to occur.  He does not have any back pain.  He gets occasional pain in the posterior left hip.  He denies any right-sided symptoms.  He is hopeful that he will be able to figure out what is going on and solve his problem so that he can run a relay race in [place] in March.  Each person on the team will run 20 miles.  He is working with his physical therapist but seems to have plateaued.  Patient states that he lost his left Achilles reflex prior to his surgery.               ROS: He is otherwise in excellent  health, no chronic medical problems.  Not on any long-term medications right now.  Objective: Vital Signs: There were no vitals taken for this visit.  Physical Exam:  Back: Surgical scar is well-healed.  No tenderness at the SI joints.  He does have some tenderness medial to the left ischial tuberosity, but not severe.  Leg lengths appear equal.  He has 5/5 hip flexion, knee extension, ankle dorsiflexion, EHL, ankle eversion strength bilaterally.  Very slight weakness in the left calf with repetitive calf raise compared to the right.  His calf circumference is 39 cm at 10 cm below the tibial tubercle in both legs.  No atrophy detected.  2+ knee DTRs, 2+ right Achilles DTR, 1+ left Achilles DTR.  Imaging: None today.  Assessment & Plan: 1.  Chronic left leg numbness status post L5-S1 microdiscectomy, but with return of Achilles reflex and no muscular atrophy which is promising that he is slowly but surely recovering. -I recommended proceeding with nerve conduction studies to further evaluate.  Depending on the results we could consider a lumbar MRI scan if it looks like he might have recurrent disc protrusion.  If studies are promising, then we will give it more time and continue with low impact activities, possibly add  some B complex vitamins to his regimen.   Follow-Up Instructions: No follow-ups on file.      Procedures: No procedures performed  No notes on file    PMFS History: Patient Active Problem List   Diagnosis Date Noted  . HNP (herniated nucleus pulposus), lumbar 12/21/2017  . Left sciatic nerve pain 11/30/2016  . Left anterior knee pain 08/10/2016  . Posterior left knee pain 05/27/2015   History reviewed. No pertinent past medical history.  History reviewed. No pertinent family history.  Past Surgical History:  Procedure Laterality Date  . LUMBAR LAMINECTOMY/DECOMPRESSION MICRODISCECTOMY Left 12/22/2017   Procedure: LEFT LUMBAR FIVE-SACRAL ONE  LAMINECTOMY and  DISCECTOMY;  Surgeon: Ashok Pall, MD;  Location: [facility];  Service: Neurosurgery;  Laterality: Left;   Social History   Occupational History  . Not on file  Tobacco Use  . Smoking status: Never Smoker  . Smokeless tobacco: Never Used  Substance and Sexual Activity  . Alcohol use: Yes  . Drug use: No  . Sexual activity: Not on file

## 2018-10-11 ENCOUNTER — Encounter (INDEPENDENT_AMBULATORY_CARE_PROVIDER_SITE_OTHER): Payer: 59 | Admitting: Physical Medicine and Rehabilitation

## 2018-11-06 ENCOUNTER — Other Ambulatory Visit (HOSPITAL_COMMUNITY): Payer: Self-pay | Admitting: Neurosurgery

## 2018-11-06 ENCOUNTER — Other Ambulatory Visit: Payer: Self-pay | Admitting: Neurosurgery

## 2018-11-06 DIAGNOSIS — M5126 Other intervertebral disc displacement, lumbar region: Secondary | ICD-10-CM

## 2018-11-11 ENCOUNTER — Encounter (HOSPITAL_COMMUNITY): Payer: Self-pay

## 2018-11-11 ENCOUNTER — Ambulatory Visit (HOSPITAL_COMMUNITY): Payer: 59

## 2019-02-13 ENCOUNTER — Other Ambulatory Visit: Payer: 59 | Admitting: Sports Medicine

## 2020-01-19 ENCOUNTER — Ambulatory Visit: Payer: Self-pay | Attending: Internal Medicine

## 2020-01-19 DIAGNOSIS — Z23 Encounter for immunization: Secondary | ICD-10-CM

## 2020-01-19 NOTE — Progress Notes (Signed)
   Covid-19 Vaccination Clinic  Name:  Patrick Clayton    MRN: [id number] DOB: [birth year]  01/19/2020  [name] was observed post Covid-19 immunization for 15 minutes without incident. He was provided with Vaccine Information Sheet and instruction to access the V-Safe system.   [name] was instructed to call 911 with any severe reactions post vaccine: Marland Kitchen Difficulty breathing  . Swelling of face and throat  . A fast heartbeat  . A bad rash all over body  . Dizziness and weakness   Immunizations Administered    Name Date Dose VIS Date Route   Pfizer COVID-19 Vaccine 01/19/2020 11:06 AM 0.3 mL 10/26/2019 Intramuscular   Manufacturer: [place]   Lot: [id number]   [place]: [id number]

## 2020-02-06 ENCOUNTER — Ambulatory Visit: Payer: Self-pay | Attending: Internal Medicine

## 2020-02-06 DIAGNOSIS — Z23 Encounter for immunization: Secondary | ICD-10-CM

## 2020-02-06 NOTE — Progress Notes (Signed)
   Covid-19 Vaccination Clinic  Name:  Jermari Boody    MRN: [id number] DOB: August 18, 1976  02/06/2020  Mr. Atallah was observed post Covid-19 immunization for 15 minutes without incident. He was provided with Vaccine Information Sheet and instruction to access the V-Safe system.   Mr. Hoilman was instructed to call 911 with any severe reactions post vaccine: Marland Kitchen Difficulty breathing  . Swelling of face and throat  . A fast heartbeat  . A bad rash all over body  . Dizziness and weakness   Immunizations Administered    Name Date Dose VIS Date Route   Pfizer COVID-19 Vaccine 02/06/2020  8:24 AM 0.3 mL 10/26/2019 Intramuscular   Manufacturer: [place]   Lot: [id number]   [place]: [id number]

## 2021-05-21 ENCOUNTER — Telehealth: Payer: Self-pay

## 2021-05-21 NOTE — Telephone Encounter (Signed)
Patient called in asking if Dr Yong Channel would accept him as a new patient. He stated that he was referred to Dr Yong Channel by Marella Chimes from [facility].

## 2021-05-22 NOTE — Telephone Encounter (Signed)
Ok to accept since referred by a doctor?

## 2021-05-22 NOTE — Telephone Encounter (Signed)
See below

## 2021-05-25 NOTE — Telephone Encounter (Signed)
Called and left a voicemail Dr Yong Channel would accept him as a new patient

## 2021-06-10 ENCOUNTER — Other Ambulatory Visit: Payer: Self-pay | Admitting: Family Medicine

## 2021-06-10 DIAGNOSIS — R590 Localized enlarged lymph nodes: Secondary | ICD-10-CM

## 2021-06-29 ENCOUNTER — Other Ambulatory Visit: Payer: Self-pay

## 2021-06-29 ENCOUNTER — Other Ambulatory Visit: Payer: Self-pay | Admitting: Family Medicine

## 2021-06-29 ENCOUNTER — Ambulatory Visit
Admission: RE | Admit: 2021-06-29 | Discharge: 2021-06-29 | Disposition: A | Discharge status: Home / Self Care | Payer: 59 | Source: Ambulatory Visit | Attending: Family Medicine | Admitting: Family Medicine

## 2021-06-29 DIAGNOSIS — R590 Localized enlarged lymph nodes: Secondary | ICD-10-CM

## 2021-09-07 ENCOUNTER — Encounter: Payer: Self-pay | Admitting: Family Medicine

## 2021-09-08 ENCOUNTER — Ambulatory Visit (INDEPENDENT_AMBULATORY_CARE_PROVIDER_SITE_OTHER): Payer: 59 | Admitting: Family Medicine

## 2021-09-08 ENCOUNTER — Other Ambulatory Visit: Payer: Self-pay

## 2021-09-08 ENCOUNTER — Encounter: Payer: Self-pay | Admitting: Family Medicine

## 2021-09-08 VITALS — BP 119/75 | HR 47 | Temp 98.1°F | Ht 70.0 in | Wt 174.0 lb

## 2021-09-08 DIAGNOSIS — Z23 Encounter for immunization: Secondary | ICD-10-CM

## 2021-09-08 DIAGNOSIS — F5102 Adjustment insomnia: Secondary | ICD-10-CM | POA: Diagnosis not present

## 2021-09-08 DIAGNOSIS — M5126 Other intervertebral disc displacement, lumbar region: Secondary | ICD-10-CM

## 2021-09-08 DIAGNOSIS — Z0000 Encounter for general adult medical examination without abnormal findings: Secondary | ICD-10-CM

## 2021-09-08 DIAGNOSIS — E785 Hyperlipidemia, unspecified: Secondary | ICD-10-CM | POA: Insufficient documentation

## 2021-09-08 DIAGNOSIS — G4700 Insomnia, unspecified: Secondary | ICD-10-CM | POA: Insufficient documentation

## 2021-09-08 DIAGNOSIS — Z1211 Encounter for screening for malignant neoplasm of colon: Secondary | ICD-10-CM

## 2021-09-08 MED ORDER — ZOLPIDEM TARTRATE 5 MG PO TABS: 5.0000 mg | ORAL_TABLET | Freq: Every evening | ORAL | 1 refills | Status: DC | PRN

## 2021-09-08 NOTE — Progress Notes (Signed)
Phone: [phone]    Subjective:  Patient presents today for their annual physical.  Prior patient of Dr. Alyson Ingles with [facility]. Chief complaint-noted.   See problem oriented charting- Review of Systems  Constitutional:  Negative for chills and fever.  HENT:  Negative for hearing loss and tinnitus.   Eyes:  Negative for blurred vision and double vision.  Respiratory:  Negative for cough and shortness of breath.   Cardiovascular:  Negative for chest pain and palpitations.  Gastrointestinal:  Negative for abdominal pain, blood in stool, constipation, diarrhea, heartburn, melena, nausea and vomiting.  Genitourinary:  Negative for dysuria and frequency.  Musculoskeletal:  Positive for back pain and joint pain. Negative for neck pain.  Skin:  Negative for itching and rash.  Neurological:  Negative for dizziness and headaches.  Endo/Heme/Allergies:  Negative for polydipsia. Does not bruise/bleed easily.  Psychiatric/Behavioral:  Negative for depression and suicidal ideas.    The following were reviewed and entered/updated in epic: Past Medical History:  Diagnosis Date   Hyperlipidemia    Insomnia    Patient Active Problem List   Diagnosis Date Noted   HNP (herniated nucleus pulposus), lumbar 12/21/2017    Priority: Low   Left anterior knee pain 08/10/2016    Priority: Low   Posterior left knee pain 05/27/2015    Priority: Low   Insomnia 09/08/2021   Hyperlipidemia 09/08/2021   Past Surgical History:  Procedure Laterality Date   LUMBAR LAMINECTOMY/DECOMPRESSION MICRODISCECTOMY Left 12/22/2017   Procedure: LEFT LUMBAR FIVE-SACRAL ONE  LAMINECTOMY and DISCECTOMY;  Surgeon: Ashok Pall, MD;  Location: [facility];  Service: Neurosurgery;  Laterality: Left;    Family History  Problem Relation Age of Onset   Healthy Mother    Pancreatic cancer Father        age 16   Atrial fibrillation Father    Healthy Sister    Breast cancer Paternal Grandmother        died in 51s     Medications- reviewed and updated Current Outpatient Medications  Medication Sig Dispense Refill   multivitamin (ONE-A-DAY MEN'S) TABS tablet Take 1 tablet by mouth daily.     zolpidem (AMBIEN) 5 MG tablet Take 1 tablet (5 mg total) by mouth at bedtime as needed for sleep (not drive for 8 hours after taking.). 15 tablet 1   No current facility-administered medications for this visit.    Allergies-reviewed and updated No Known Allergies  Social History   Social History Narrative   Married. 3 kids 45, 51, 14 in 2022[facility] [employer].       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 Oropharynx normal Neck: no thyromegaly CV: Athletic/bradycardic heart rate no murmurs rubs or gallops In left axilla there is perhaps a 1 x 1 cm lymph node-possibly smaller-mildly tender-also points to an area of tenderness over left anterior chest-not clearly palpable lymph node that has ultrasound ordered tomorrow with breast center Lungs: CTAB no crackles, wheeze, rhonchi Abdomen: soft/nontender/nondistended/normal bowel sounds. No rebound or guarding.  Ext: no edema Skin: warm, dry  Neuro: grossly normal, moves all extremities, PERRLA     Assessment and Plan:  45 y.o. male presenting for annual physical.  Health Maintenance counseling: 1. Anticipatory guidance: Patient counseled regarding regular dental exams -q6 months, eye exams - no recent issues- last 3 years ago,  avoiding smoking and second hand smoke , limiting alcohol to 2 beverages per day- about 6 a week, no illicit drugs.   2. Risk factor reduction:   Advised patient of need for regular exercise and diet rich and fruits and vegetables to reduce risk of heart attack and stroke. Exercise-  5 days a week - enjoys running and f3 . Diet- intermittent fasting for 3.5 years- eat within 8 hours including lunch and dinner and fast the rest. If has a long run may need a piece of fruit on those days.  Wt Readings from Last 3 Encounters:  09/08/21 174 lb (78.9 kg)  12/22/17 182 lb 5.1 oz (82.7 kg)  11/30/16 175 lb (79.4 kg)  3. Immunizations/screenings/ancillary studies-received flu shot today -rabies shot a few years ago- helped get a bat out -Discussed one-time one-time screening for HIV and hepatitis C  - consider in the future if we draw blood -Discussed bilaterally vaccination for COVID bivalent. Has never had covid  Immunization History  Administered Date(s) Administered   PFIZER(Purple Top)SARS-COV-2 Vaccination 01/19/2020, 02/06/2020, 11/15/2020   Rabies, IM 05/09/2014, 05/12/2014, 05/16/2014   Tdap 11/15/2017  4. Prostate cancer screening- had PSA with [facility] recently. He may prefer to repeat next year  5. Colon cancer screening - will refer for baseline colonoscopy 6. Skin cancer screening/prevention- intermittent dermatology visits. advised regular sunscreen use. Denies worrisome, changing, or new skin lesions.  7. Testicular cancer screening- advised monthly self exams  8. STD screening- patient opts out- only active with wife 27. Smoking associated screening- never smoker  Status of chronic or acute concerns    Adenopathy    Recent mychart message: One item I would like to review in our physical exam tomorrow is continued discomfort on lymph nodes in the left side under arm pit and down along rib change. First noticed in July. - in June of this year patient noted some tenderness along a lymph node- originally thought could be muscle strain. First visit was in early august -mammogram and ultrasound were done On 06/29/21 "IMPRESSION: 1.  Single left axillary lymph node with borderline eccentric cortical thickening. This most likely represents a resolving reactive lymph node. 2. No evidence of malignancy in either breast.   RECOMMENDATION: Left axillary ultrasound in 3 months. The patient was instructed to return sooner if he notices any interval palpable enlargement of the borderline lymph node."  - recently had a set of labs recently to look into this with Dr. Nancy Fetter- sounds like at least CBC - seemed to get slightly better - but has noted area tends to come and go -for last 3 weeks has been more noticeable again- pain and enlargement in side and noted some tenderness along rib cage  (thought may have felt lymph node- dull pain in AM) and faintly along left low back (above hip)  - scheduled for ultrasound tomorrow but then in last 10 days has noted some mild improvement -better with working out. No chest pain or shortness of breath. No cough.   -consider surgical consult for persistent lymph node depending on results and persistence   # history of discectomy 3 years ago L5-S1 S:ongoing back issues since that time . Yoga once a week and  decompression exercises such as upward dog. 2 aleve intermittently in AM and then PM (prescription strength) to decrease inflammation. No regular doctor- mckenzie method PT with [facility]. Chiropractor every 6 weeks typically- Susie Cassette. Does inversion table. Also takes turmeric and vitamin C and vitamin D. Also does glucosamine- chondroitin -does not see [facility] often - sometimes calf will twitch still on left A/P: patient is treating this appropriately- do not see any clear needed adjustments   # Insomnia S:only takes [place] with international flights  A/P: has had a lot of travel lately and needs refill.     Recommended follow up: Return in about 1 year (around 09/08/2022) for a physical or sooner if needed. Future Appointments  Date Time Provider [place]  09/09/2021  12:40 PM [facility] [place] [facility]   Lab/Order associations:will get copy of recent labs   ICD-10-CM   1. Preventative health care  Z00.00     2. HNP (herniated nucleus pulposus), lumbar  M51.26     3. Adjustment insomnia  F51.02     4. Hyperlipidemia, unspecified hyperlipidemia type  E78.5     5. Screen for colon cancer  Z12.11 Ambulatory referral to Gastroenterology      Meds ordered this encounter  Medications   zolpidem (AMBIEN) 5 MG tablet    Sig: Take 1 tablet (5 mg total) by mouth at bedtime as needed for sleep (not drive for 8 hours after taking.).    Dispense:  15 tablet    Refill:  1     Return precautions advised.   Garret Reddish, MD

## 2021-09-08 NOTE — Patient Instructions (Addendum)
Health Maintenance Due  Topic Date Due   HIV Screening - Consider in the future if we draw blood. Never done   Hepatitis C Screening  - Consider in the future if we draw blood. Never done   COLONOSCOPY (Pts 45-75yrs Insurance coverage will need to be confirmed)  - [facility] contact Address: [address], [place], [place] [address] Phone: [phone]  Never done   COVID-19 Vaccine (3 - Booster for [employer] series) - Recommend getting Omicron/Bivalent booster only at your local pharmacy! Please let us know when you have received this vaccination.  04/02/2020   INFLUENZA VACCINE  - Flu shot today before you leave.  Never done   I think it is reasonable for you to go to your scheduled ultrasound in regards to you lymphadenopathy.if worsening symptoms or persistent we can look at general surgery consult  Sign release of information at the check out desk for last 2 years from [place] including recent labs. Team please make a note to place on my desk  Recommended follow up: Return in about 1 year (around 09/08/2022) for a physical or sooner if needed.

## 2021-09-09 ENCOUNTER — Ambulatory Visit
Admission: RE | Admit: 2021-09-09 | Discharge: 2021-09-09 | Disposition: A | Discharge status: Home / Self Care | Payer: 59 | Source: Ambulatory Visit | Attending: Family Medicine | Admitting: Family Medicine

## 2021-09-09 DIAGNOSIS — R590 Localized enlarged lymph nodes: Secondary | ICD-10-CM

## 2021-09-10 ENCOUNTER — Encounter: Payer: Self-pay | Admitting: Gastroenterology

## 2021-10-01 ENCOUNTER — Other Ambulatory Visit: Payer: 59

## 2021-10-31 ENCOUNTER — Ambulatory Visit (HOSPITAL_COMMUNITY)
Admission: EM | Admit: 2021-10-31 | Discharge: 2021-10-31 | Disposition: A | Discharge status: Home / Self Care | Payer: 59

## 2021-10-31 ENCOUNTER — Encounter (HOSPITAL_COMMUNITY): Payer: Self-pay | Admitting: Emergency Medicine

## 2021-10-31 ENCOUNTER — Other Ambulatory Visit: Payer: Self-pay

## 2021-10-31 DIAGNOSIS — J101 Influenza due to other identified influenza virus with other respiratory manifestations: Secondary | ICD-10-CM

## 2021-10-31 DIAGNOSIS — R051 Acute cough: Secondary | ICD-10-CM

## 2021-10-31 DIAGNOSIS — R52 Pain, unspecified: Secondary | ICD-10-CM

## 2021-10-31 LAB — POC INFLUENZA A AND B ANTIGEN (URGENT CARE ONLY)
INFLUENZA A ANTIGEN, POC: POSITIVE — AB
INFLUENZA B ANTIGEN, POC: NEGATIVE

## 2021-10-31 MED ORDER — PROMETHAZINE-DM 6.25-15 MG/5ML PO SYRP: 5.0000 mL | ORAL_SOLUTION | Freq: Four times a day (QID) | ORAL | 0 refills | Status: DC | PRN

## 2021-10-31 MED ORDER — OSELTAMIVIR PHOSPHATE 75 MG PO CAPS: 75.0000 mg | ORAL_CAPSULE | Freq: Two times a day (BID) | ORAL | 0 refills | Status: DC

## 2021-10-31 NOTE — ED Provider Notes (Signed)
[facility]    CSN: [id number] Arrival date & time: 10/31/21  1110      History   Chief Complaint Chief Complaint  Patient presents with   Generalized Body Aches    HPI Patrick Clayton is a 45 y.o. male.   Patient presents today with a 24-hour history of URI symptoms.  Reports nasal congestion, sore throat, fatigue, subjective fever, chills, body aches, cough.  Denies any chest pain, shortness of breath, nausea, vomiting, diarrhea.  He has tried Tylenol without improvement of symptoms.  He denies any known sick contacts.  He has had influenza and COVID-19 vaccinations.  He denies any recent antibiotic use.  He denies any history of allergies, asthma, COPD.  He does not smoke.  He did take an at home COVID test that was negative.   Past Medical History:  Diagnosis Date   Hyperlipidemia    Insomnia     Patient Active Problem List   Diagnosis Date Noted   Insomnia 09/08/2021   Hyperlipidemia 09/08/2021   HNP (herniated nucleus pulposus), lumbar 12/21/2017   Left anterior knee pain 08/10/2016   Posterior left knee pain 05/27/2015    Past Surgical History:  Procedure Laterality Date   LUMBAR LAMINECTOMY/DECOMPRESSION MICRODISCECTOMY Left 12/22/2017   Procedure: LEFT LUMBAR FIVE-SACRAL ONE  LAMINECTOMY and DISCECTOMY;  Surgeon: Ashok Pall, MD;  Location: [facility];  Service: Neurosurgery;  Laterality: Left;       Home Medications    Prior to Admission medications   Medication Sig Start Date End Date Taking? Authorizing Provider  acetaminophen (TYLENOL) 325 MG tablet Take 650 mg by mouth every 6 (six) hours as needed.   Yes [provider]  oseltamivir (TAMIFLU) 75 MG capsule Take 1 capsule (75 mg total) by mouth every 12 (twelve) hours. 10/31/21  Yes Taylan Marez K, PA-C  promethazine-dextromethorphan (PROMETHAZINE-DM) 6.25-15 MG/5ML syrup Take 5 mLs by mouth 4 (four) times daily as needed for cough. 10/31/21  Yes Nazaret Chea, Junie Panning K, PA-C  multivitamin  (ONE-A-DAY MEN'S) TABS tablet Take 1 tablet by mouth daily. Patient not taking: Reported on 10/31/2021    [provider]  zolpidem (AMBIEN) 5 MG tablet Take 1 tablet (5 mg total) by mouth at bedtime as needed for sleep (not drive for 8 hours after taking.). Patient not taking: Reported on 10/31/2021 09/08/21   Marin Olp, MD    Family History Family History  Problem Relation Age of Onset   Healthy Mother    Pancreatic cancer Father        age 67   Atrial fibrillation Father    Healthy Sister    Breast cancer Paternal Grandmother        died in 59s    Social History Social History   Tobacco Use   Smoking status: Never   Smokeless tobacco: Never  Vaping Use   Vaping Use: Never used  Substance Use Topics   Alcohol use: Yes    Alcohol/week: 6.0 standard drinks    Types: 6 Cans of beer per week   Drug use: No     Allergies   Patient has no known allergies.   Review of Systems Review of Systems  Constitutional:  Positive for activity change, appetite change, chills, fatigue and fever.  HENT:  Positive for congestion and sore throat. Negative for sinus pressure and sneezing.   Respiratory:  Positive for cough. Negative for shortness of breath.   Cardiovascular:  Negative for chest pain.  Gastrointestinal:  Negative for abdominal  pain, diarrhea, nausea and vomiting.  Musculoskeletal:  Positive for arthralgias and myalgias.  Neurological:  Positive for headaches. Negative for dizziness and light-headedness.    Physical Exam Triage Vital Signs ED Triage Vitals  Enc Vitals Group     BP 10/31/21 1210 114/63     Pulse Rate 10/31/21 1210 75     Resp 10/31/21 1210 18     Temp 10/31/21 1210 99 F (37.2 C)     Temp Source 10/31/21 1210 Oral     SpO2 10/31/21 1210 99 %     Weight --      Height --      Head Circumference --      Peak Flow --      Pain Score 10/31/21 1207 2     Pain Loc --      Pain Edu? --      Excl. in [place]? --    No data  found.  Updated Vital Signs BP 114/63 (BP Location: Left Arm)    Pulse 75    Temp 99 F (37.2 C) (Oral)    Resp 18    SpO2 99%   Visual Acuity Right Eye Distance:   Left Eye Distance:   Bilateral Distance:    Right Eye Near:   Left Eye Near:    Bilateral Near:     Physical Exam Vitals reviewed.  Constitutional:      General: He is awake.     Appearance: Normal appearance. He is well-developed. He is not ill-appearing.     Comments: Very pleasant male appears stated age in no acute distress  HENT:     Head: Normocephalic and atraumatic.     Right Ear: Tympanic membrane, ear canal and external ear normal. Tympanic membrane is not erythematous or bulging.     Left Ear: Tympanic membrane, ear canal and external ear normal. Tympanic membrane is not erythematous or bulging.     Nose: Nose normal.     Mouth/Throat:     Pharynx: Uvula midline. Posterior oropharyngeal erythema present. No oropharyngeal exudate or uvula swelling.     Comments: Erythema and drainage in posterior oropharynx Cardiovascular:     Rate and Rhythm: Normal rate and regular rhythm.     Heart sounds: Normal heart sounds, S1 normal and S2 normal. No murmur heard. Pulmonary:     Effort: Pulmonary effort is normal. No accessory muscle usage or respiratory distress.     Breath sounds: Normal breath sounds. No stridor. No wheezing, rhonchi or rales.     Comments: Clear to auscultation bilaterally Abdominal:     General: Bowel sounds are normal.     Palpations: Abdomen is soft.     Tenderness: There is no abdominal tenderness.  Neurological:     Mental Status: He is alert.  Psychiatric:        Behavior: Behavior is cooperative.     UC Treatments / Results  Labs (all labs ordered are listed, but only abnormal results are displayed) Labs Reviewed  POC INFLUENZA A AND B ANTIGEN (URGENT CARE ONLY) - Abnormal; Notable for the following components:      Result Value   INFLUENZA A ANTIGEN, POC POSITIVE (*)     All other components within normal limits    EKG   Radiology No results found.  Procedures Procedures (including critical care time)  Medications Ordered in UC Medications - No data to display  Initial Impression / Assessment and Plan / UC Course  I have reviewed  the triage vital signs and the nursing notes.  Pertinent labs & imaging results that were available during my care of the patient were reviewed by me and considered in my medical decision making (see chart for details).     Patient has a positive for influenza A.  He is within 72 hours of symptom onset so we will start Tamiflu 75 mg twice daily.  He was given Promethazine DM for cough with instruction not to drive or drink alcohol while taking this medication as drowsiness is a common side effect.  He can use over-the-counter medications including plain Mucinex, Flonase, Tylenol for symptom relief.  Recommend he rest and drink plenty of fluid.  He was provided work excuse note.  Discussed alarm symptoms that warrant emergent evaluation.  Strict return precautions given to which he expressed understanding.  Final Clinical Impressions(s) / UC Diagnoses   Final diagnoses:  Influenza A  Body aches  Acute cough     Discharge Instructions      You tested positive for influenza A.  Please start Tamiflu twice daily for 5 days.  I have called in Promethazine DM for cough.  This will make you sleepy so do not drive or drink alcohol while taking it.  Use plain Mucinex, Flonase, Tylenol for additional symptom relief.  Make sure you rest and drink plenty of fluid.  You can return to activities once you have been fever free and symptoms have improved for 24 hours without medication use.  If you have any worsening symptoms including high fever not responding to medication, chest pain, shortness of breath, severe cough, nausea/vomiting interfering with oral intake you need to go to the emergency room.     ED Prescriptions      Medication Sig Dispense Auth. Provider   promethazine-dextromethorphan (PROMETHAZINE-DM) 6.25-15 MG/5ML syrup Take 5 mLs by mouth 4 (four) times daily as needed for cough. 118 mL Gottfried Standish K, PA-C   oseltamivir (TAMIFLU) 75 MG capsule Take 1 capsule (75 mg total) by mouth every 12 (twelve) hours. 10 capsule Dana Dorner, Derry Skill, PA-C      PDMP not reviewed this encounter.   Terrilee Croak, PA-C 10/31/21 1315

## 2021-10-31 NOTE — ED Triage Notes (Signed)
Fever, body aches, throat congestion, sinus tenderness, cough and generally in a fog.  Marland Kitchen

## 2021-10-31 NOTE — Discharge Instructions (Signed)
You tested positive for influenza A.  Please start Tamiflu twice daily for 5 days.  I have called in Promethazine DM for cough.  This will make you sleepy so do not drive or drink alcohol while taking it.  Use plain Mucinex, Flonase, Tylenol for additional symptom relief.  Make sure you rest and drink plenty of fluid.  You can return to activities once you have been fever free and symptoms have improved for 24 hours without medication use.  If you have any worsening symptoms including high fever not responding to medication, chest pain, shortness of breath, severe cough, nausea/vomiting interfering with oral intake you need to go to the emergency room.

## 2021-11-05 ENCOUNTER — Other Ambulatory Visit: Payer: Self-pay

## 2021-11-05 ENCOUNTER — Ambulatory Visit (AMBULATORY_SURGERY_CENTER): Payer: 59 | Admitting: *Deleted

## 2021-11-05 VITALS — Ht 70.0 in | Wt 174.0 lb

## 2021-11-05 DIAGNOSIS — Z1211 Encounter for screening for malignant neoplasm of colon: Secondary | ICD-10-CM

## 2021-11-05 MED ORDER — NA SULFATE-K SULFATE-MG SULF 17.5-3.13-1.6 GM/177ML PO SOLN: 1.0000 | ORAL | 0 refills | Status: DC

## 2021-11-05 NOTE — Progress Notes (Signed)
Patient's pre-visit was done today over the phone with the patient. Name,DOB and address verified. Patient denies any allergies to Eggs and Soy. Patient denies any problems with anesthesia/sedation. Patient is not taking any diet pills or blood thinners. No home Oxygen. Packet of Prep instructions mailed to patient including a copy of a consent form-pt is aware. Prep instructions sent to pt's MyChart (if activated).Patient understands to call us back with any questions or concerns. Patient is aware of our care-partner policy and Covid-19 safety protocol.  ° °EMMI education assigned to the patient for the procedure, sent to MyChart.  ° °The patient is COVID-19 vaccinated.   °

## 2021-11-11 IMAGING — US US AXILLARY LEFT
1 series · 6 of 6 positions shown · non-contrast
Comparison: None.

ACR Breast Density Category a: The breast tissue is almost entirely
fatty.

CLINICAL DATA: Enlarged, tender lymph nodes felt by the patient in
the left axilla and inferior to the axilla beginning in late April 2021. In the past 2 weeks, these have become smaller and less
tender.

EXAM:
DIGITAL DIAGNOSTIC BILATERAL MAMMOGRAM WITH TOMOSYNTHESIS AND CAD;
US AXILLARY LEFT
TECHNIQUE: Bilateral digital diagnostic mammography and breast tomosynthesis
was performed. The images were evaluated with computer-aided
detection.; Targeted ultrasound examination of the left axilla was
performed.

[Series 1: us axillary left · 0.06mm/px · 6 of 6 slices shown]
[im 1/6]
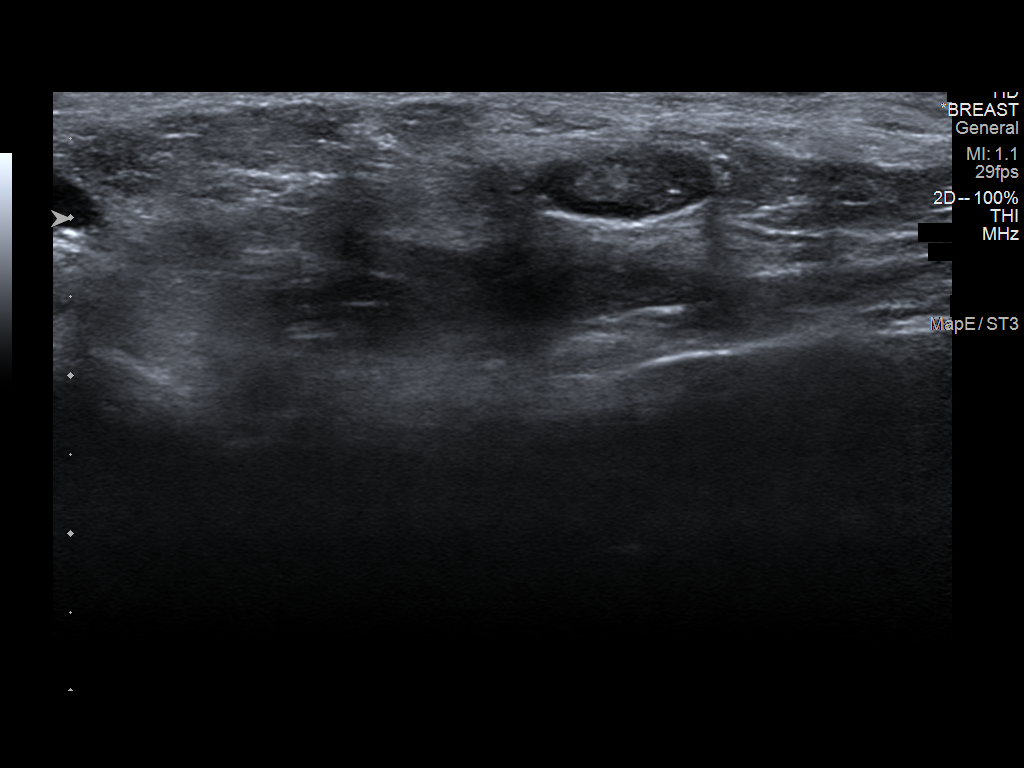
[im 2/6]
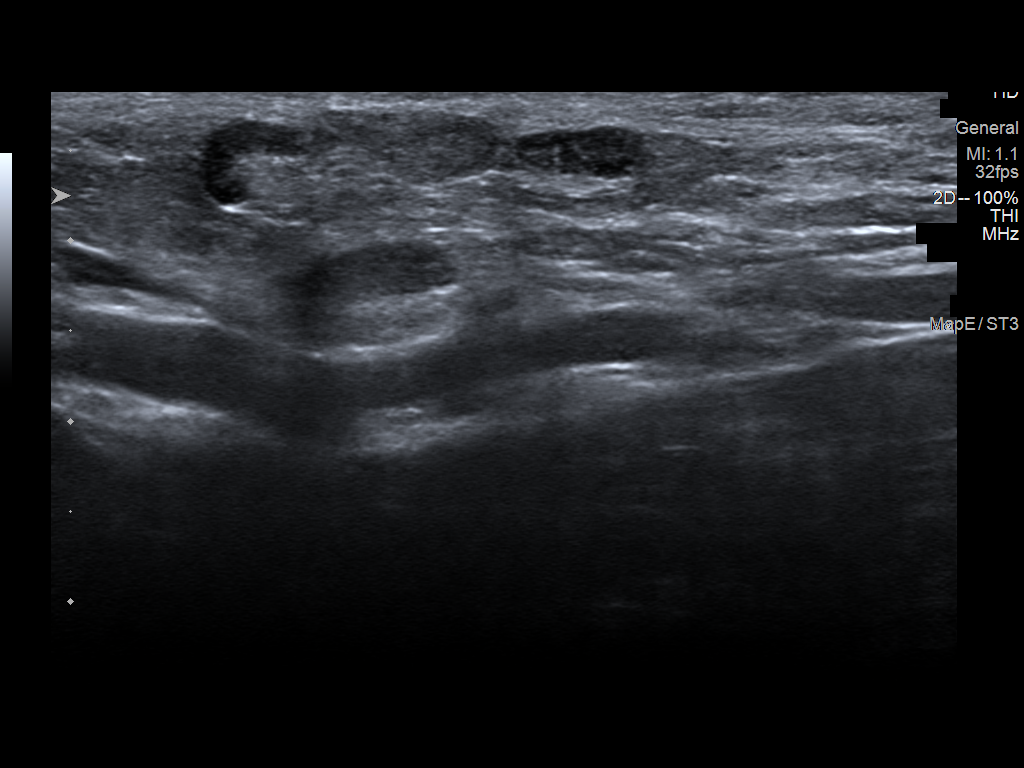
[im 3/6]
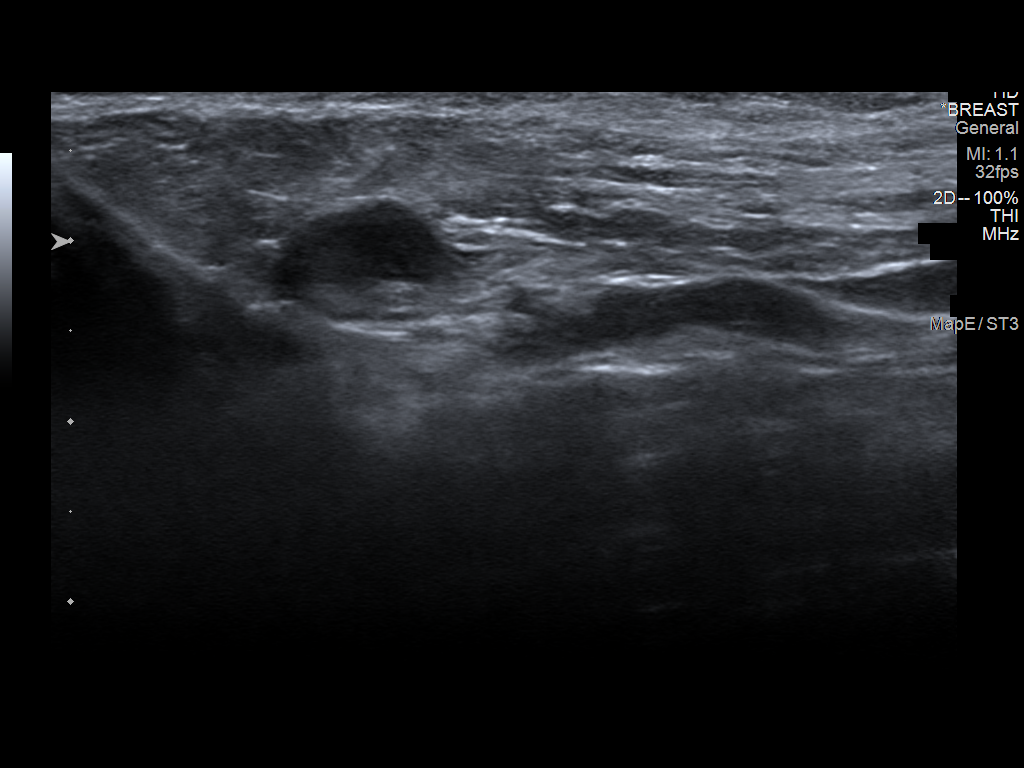
[im 4/6]
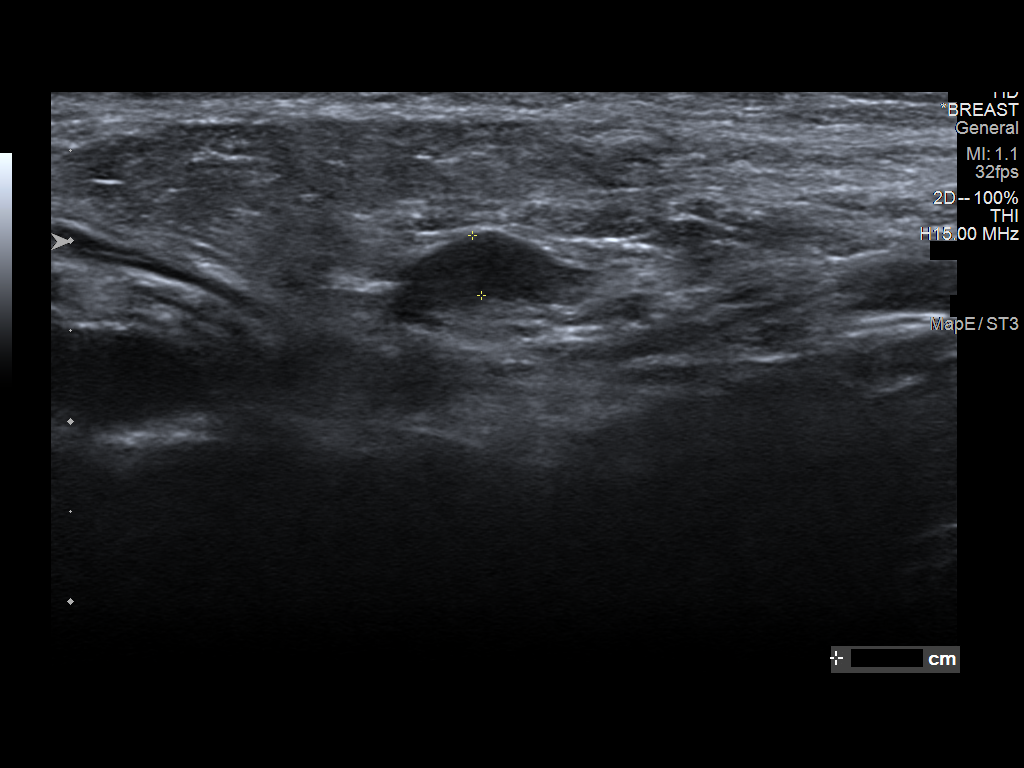
[im 5/6]
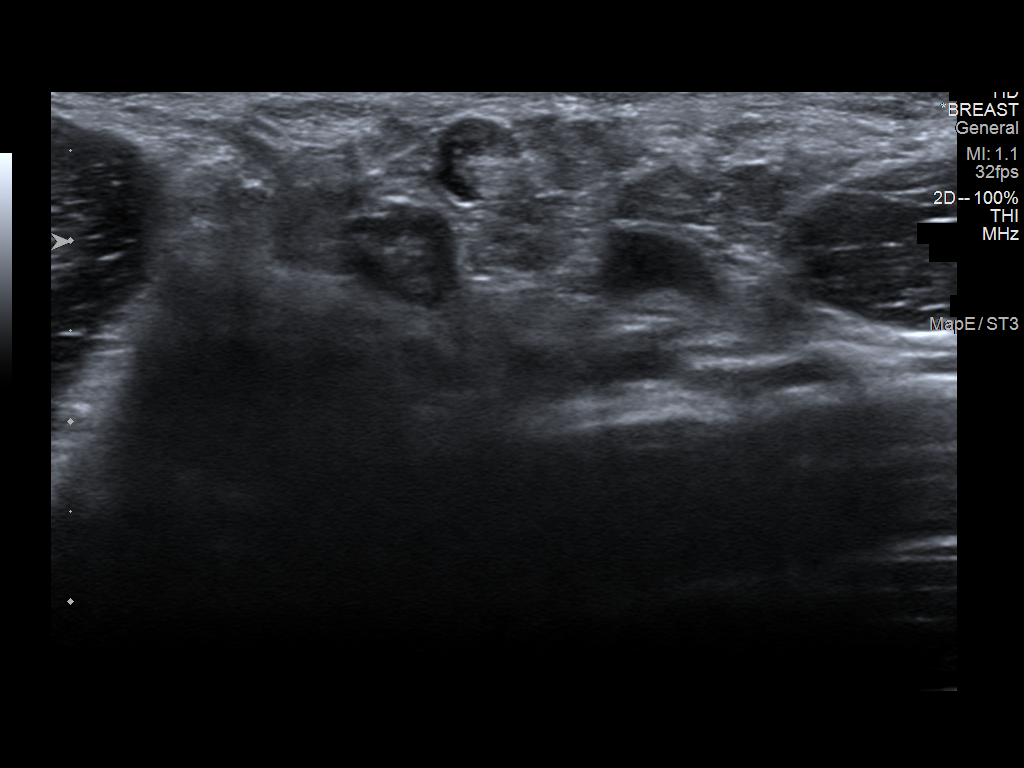
[im 6/6]
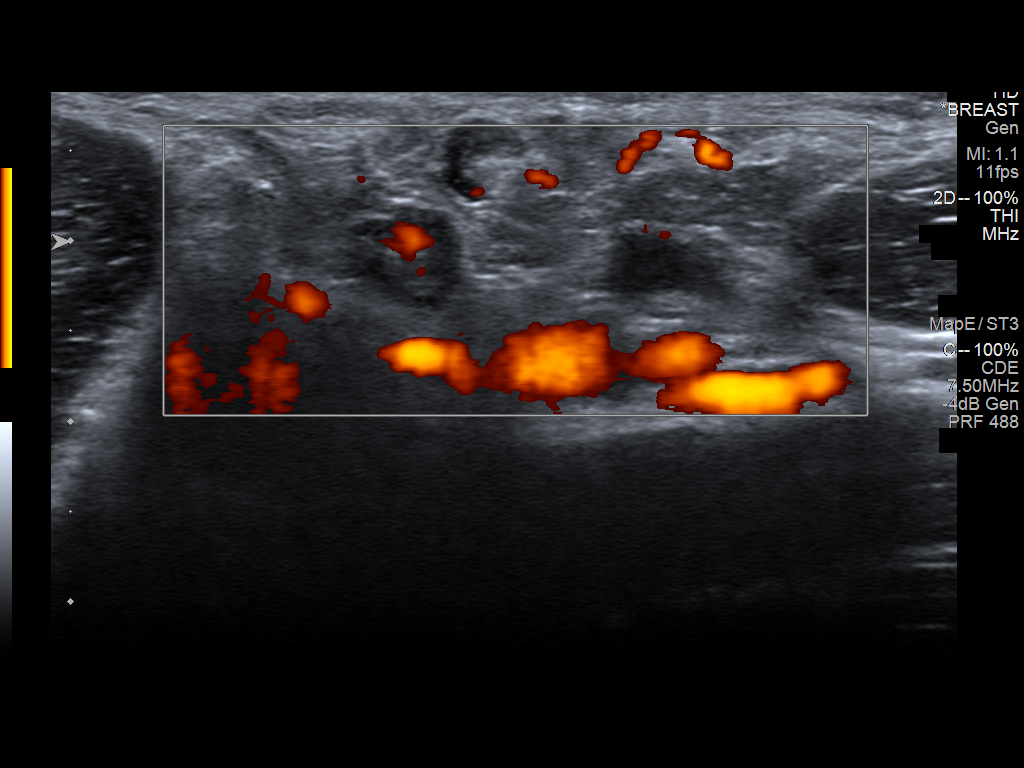

[6 of 6 positions shown; findings below may reference images not displayed]

FINDINGS: Normal appearing subcutaneous fatty tissue in both breasts with no
glandular tissue seen. Normal appearing fatty tissue at the location
of the mass felt by the patient, marked a metallic marker, inferior
to the left axilla.

On physical exam, there is no palpable mass or tenderness in the
left axilla or inferior to the axilla.

Targeted ultrasound is performed, showing a single left axillary
lymph node with borderline eccentric cortical thickening measuring
3.4 mm in maximum thickness. This is not tender to palpation today.
However, the patient reported that is where he was tender
previously. There are multiple additional normal appearing left
axillary lymph nodes.
IMPRESSION: 1. Single left axillary lymph node with borderline eccentric
cortical thickening. This most likely represents a resolving
reactive lymph node.
2. No evidence of malignancy in either breast.

RECOMMENDATION:
Left axillary ultrasound in 3 months. The patient was instructed to
return sooner if he notices any interval palpable enlargement of the
borderline lymph node.

I have discussed the findings and recommendations with the patient.
If applicable, a reminder letter will be sent to the patient
regarding the next appointment.

BI-RADS CATEGORY  3: Probably benign.

## 2021-11-17 ENCOUNTER — Telehealth: Payer: Self-pay

## 2021-11-17 ENCOUNTER — Telehealth: Payer: Self-pay | Admitting: Gastroenterology

## 2021-11-17 NOTE — Telephone Encounter (Signed)
Left patient vm to see how he was feeling and if he needed to schedule appt.   Patient Name: Patrick Clayton Gender: Male DOB: [birth year] Age: 46 Y 9 M 9 D Return Phone Number: [phone] (Primary), [phone] (Secondary) Address: City/ State/ Zip: [place]  [address] Client [facility] Client Site [facility] [place] Night Provider Garret Reddish- MD Contact Type Call Who Is Calling Patient / Member / Family / Caregiver Call Type Triage / Clinical Relationship To Patient Self Return Phone Number [phone] (Primary) Chief Complaint Fever (non-urgent symptom) (greater than THREE MONTHS old) Reason for Call Symptomatic / Request for Health Information Initial Comment Caller is + Covid, recommend antiviral? Caller has a chronic runny nose, fever at night, coughing, head congestion, body aches. Symptoms began on 12/29. Last temp 100. Caller states normal is 97 for him. Translation No Nurse Assessment Nurse: Doyle Askew, RN, Beth Date/Time (Eastern Time): 11/14/2021 9:05:05 AM Confirm and document reason for call. If symptomatic, describe symptoms. ---Caller is + Covid last night, recommend antiviral? Caller has a chronic runny nose, fever at night, coughing, head congestion, body aches. Symptoms began on 12/29. Last temp 100. Caller states normal is 97 for him. Does the patient have any new or worsening symptoms? ---Yes Will a triage be completed? ---Yes Related visit to physician within the last 2 weeks? ---N/A Does the PT have any chronic conditions? (i.e. diabetes, asthma, this includes High risk factors for pregnancy, etc.) ---No Is this a behavioral health or substance abuse call? ---No Guidelines Guideline Title Affirmed Question Affirmed Notes Nurse Date/Time (Burt Time) COVID-19 - Diagnosed or Suspected [1] COVID-19 diagnosed by positive lab test (e.g., PCR, rapid Doyle Askew, RN, Beth  11/14/2021 9:06:25 Guidelines Guideline Title Affirmed Question Affirmed Notes Nurse Date/Time Eilene Ghazi Time) self-test kit) AND [2] mild symptoms (e.g., cough, fever, others) AND [9] no complications or SOB Disp. Time Eilene Ghazi Time) Disposition Final User 11/14/2021 9:10:58 AM Home Care Yes Doyle Askew, RN, Beth Caller Disagree/Comply Comply Caller Understands Yes PreDisposition Did not know what to do Care Advice Given Per Guideline HOME CARE: * You should be able to treat this at home. * The symptoms are generally treated the same whether you have COVID-19, influenza or some other respiratory virus. GENERAL CARE ADVICE FOR COVID-19 SYMPTOMS: * Feeling dehydrated: Drink extra liquids. If the air in your home is dry, use a humidifier. COUGH SYRUP WITH DEXTROMETHORPHAN - EXTRA NOTES AND WARNINGS: * Do not try to completely stop coughs that produce mucus and phlegm. * Dry air makes coughs worse. HUMIDIFIER: * If the air is dry, use a humidifier in the bedroom. * From what you have told me, your symptoms are mild. That is reassuring. REASSURANCE AND EDUCATION - POSITIVE COVID-19 LAB TEST AND MILD SYMPTOMS: COVID-19 - HOW TO PROTECT OTHERS - WHEN YOU ARE SICK WITH COVID-19: * STAY HOME A MINIMUM OF 5 DAYS: People with MILD COVID-19 can STOP HOME ISOLATION AFTER 5 DAYS if (1) fever has been gone for 24 hours (without using fever medicine) AND (2) symptoms are better. Continue to wear a well-fitted mask for a full 10 days when around others. CALL BACK IF: * Fever over 103 F (39.4 C) * Fever lasts over 3 days * Fever returns after being gone for 24 hours * Chest pain or difficulty breathing occurs * You become worse CARE ADVICE given per COVID-19 - DIAGNOSED OR SUSPECTED (Adult) guideline.

## 2021-11-17 NOTE — Telephone Encounter (Signed)
Patient returned your call.  He started having symptoms last Thursday the 29th and tested positive on the 30th.  If you have any further questions, please call him.  Thank you.

## 2021-11-17 NOTE — Telephone Encounter (Signed)
Spoke with the patient. He says he feels fine, like he is at "the end of a cold." Afebrile and has not been taking any medications. Please advise on his procedure which is 11/20/21. He wants to go forward with it if it is okay.

## 2021-11-17 NOTE — Telephone Encounter (Signed)
Called the patient back. Got his voicemail. I need to know the date he tested positive. The date his symptoms started.

## 2021-11-17 NOTE — Telephone Encounter (Signed)
Inbound call from patient, states that he has some questions, stated that he had covid and is seeking advice to make sure its okay for him to still come, please advise.   [phone]

## 2021-11-18 ENCOUNTER — Encounter: Payer: Self-pay | Admitting: Gastroenterology

## 2021-11-18 NOTE — Telephone Encounter (Signed)
Called the patient. No answer. Left a message advising him not to start his prep, to call us asap to reschedule.

## 2021-11-18 NOTE — Telephone Encounter (Signed)
Given he had positive COVID we will have to wait for at least 10 days prior to procedure, even though most of his symptoms have resolved, can still have reactive airways specially when we put him through anesthesia.  We can do the procedure after January 9.  Thank you

## 2021-11-19 NOTE — Telephone Encounter (Signed)
Spoke with patient and gave next available dates but could not schedule due to conflict with work schedule.  Will call back to schedule for March.  Appt has been canceled.

## 2021-11-19 NOTE — Telephone Encounter (Signed)
Called the patient. Got voicemail. Left another message that the procedure has to be rescheduled for his safety due to [place]. Concerns for his airway. The doctor says after 11/23/21 will be best.

## 2021-11-19 NOTE — Telephone Encounter (Signed)
Patient returned call. Best contact number [phone]

## 2021-11-20 ENCOUNTER — Encounter: Payer: 59 | Admitting: Gastroenterology

## 2021-12-03 ENCOUNTER — Ambulatory Visit: Payer: Self-pay | Admitting: Family Medicine

## 2021-12-03 ENCOUNTER — Encounter: Payer: Self-pay | Admitting: Gastroenterology

## 2022-01-18 ENCOUNTER — Telehealth: Payer: Self-pay

## 2022-01-18 ENCOUNTER — Ambulatory Visit: Payer: 59

## 2022-01-18 NOTE — Telephone Encounter (Signed)
Multiple attempts made to reach patient- no answer- left message for patient to call back to reschedule PV and procedure appts; those appts cancelled as patient is a no show to PV appt today. No show letter sent to patient via MyChart; ?

## 2022-01-22 IMAGING — US US AXILLARY LEFT
1 series · 11 of 11 positions shown · non-contrast
Comparison: 06/29/2021

CLINICAL DATA: Short-term follow-up for probably benign prominent
left axillary lymph nodes. Patient also reports a small nodule
between left anterolateral ribs. Reports that the axilla prominence
waxes and wanes.

EXAM:
ULTRASOUND OF THE LEFT AXILLA

[Series 1: us axillary left · 0.04mm/px · 11 of 11 slices shown]
[im 1/11]
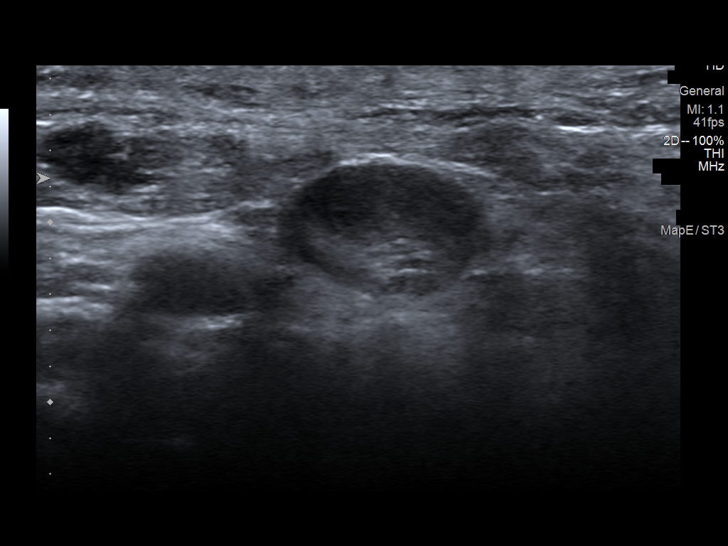
[im 2/11]
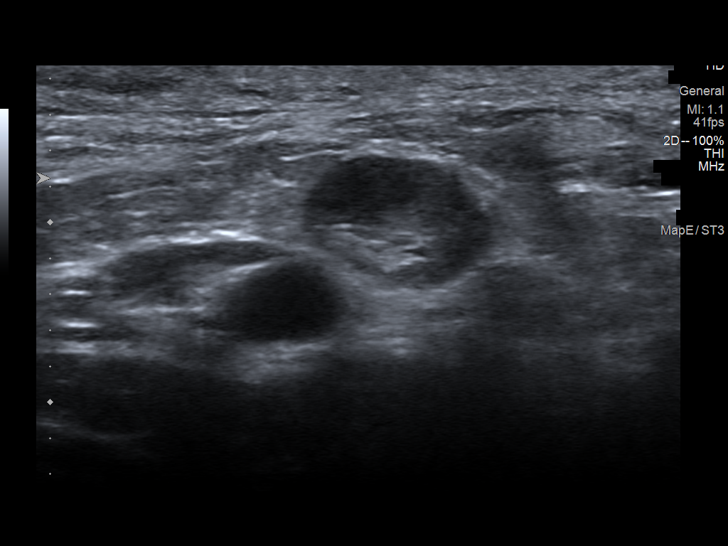
[im 3/11]
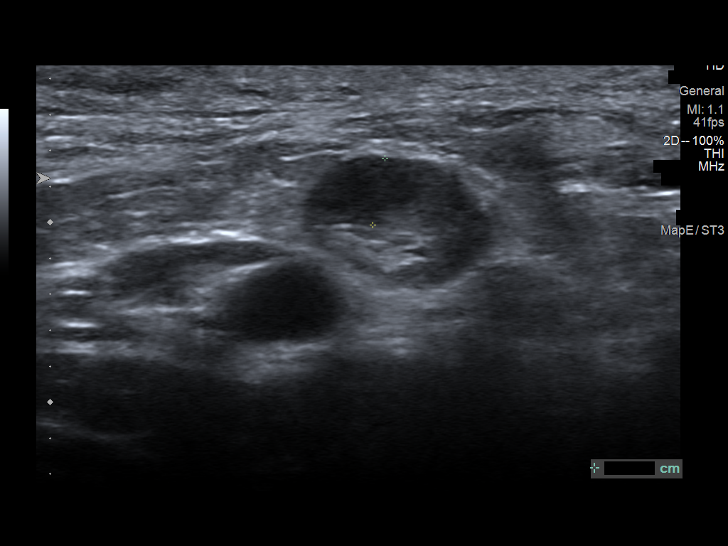
[im 4/11]
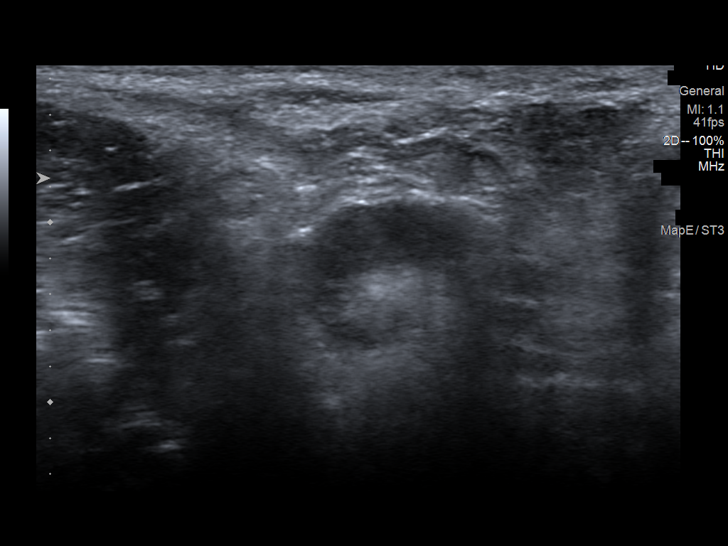
[im 5/11]
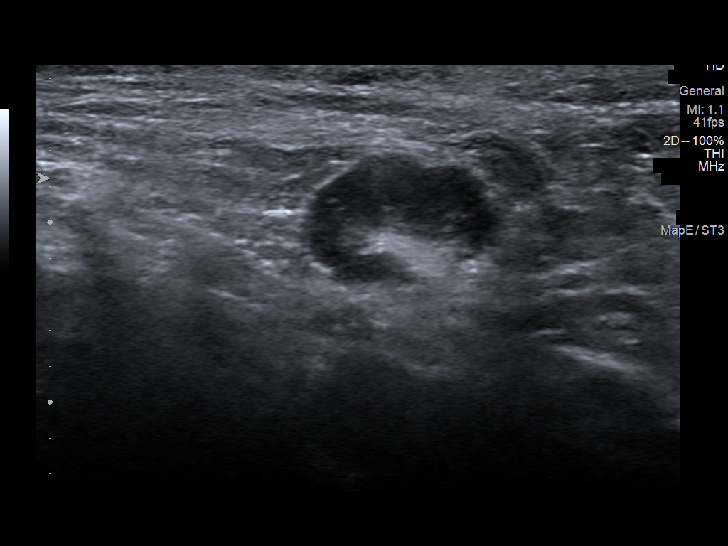
[im 6/11]
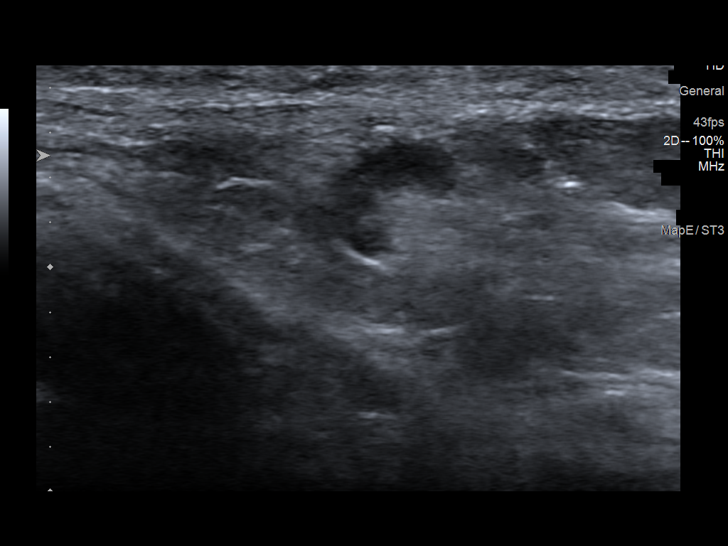
[im 7/11]
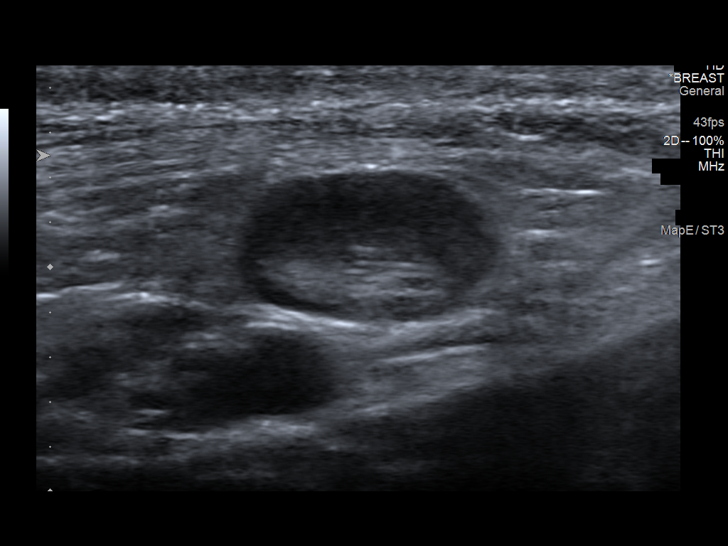
[im 8/11]
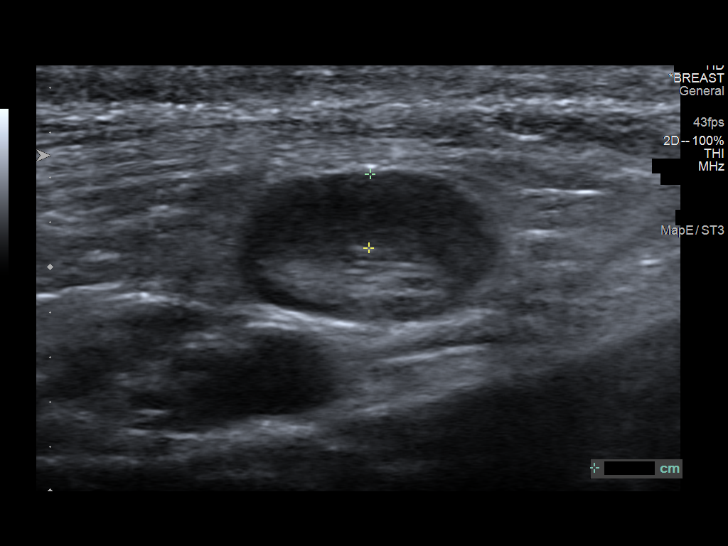
[im 9/11]
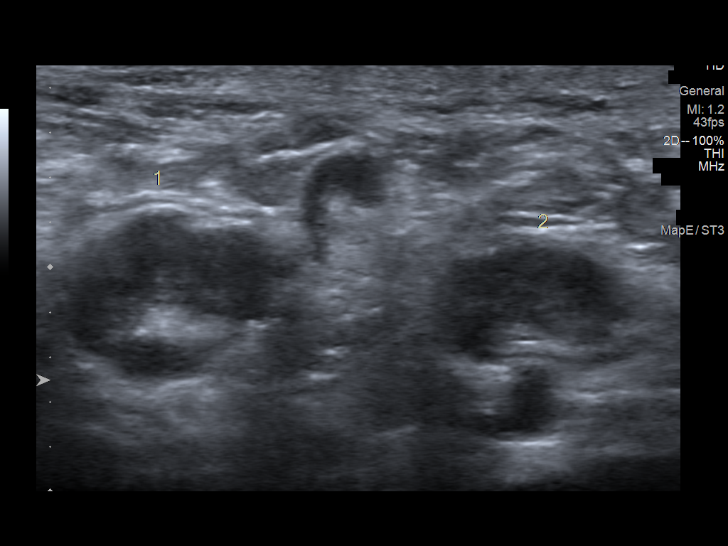
[im 10/11]
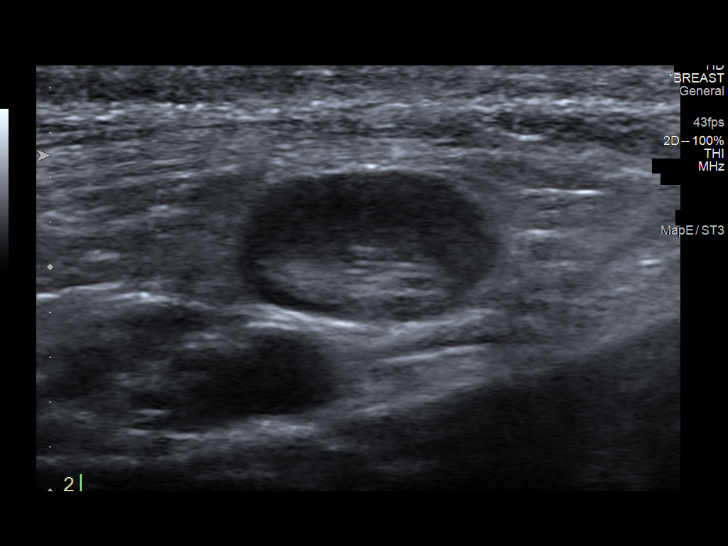
[im 11/11]
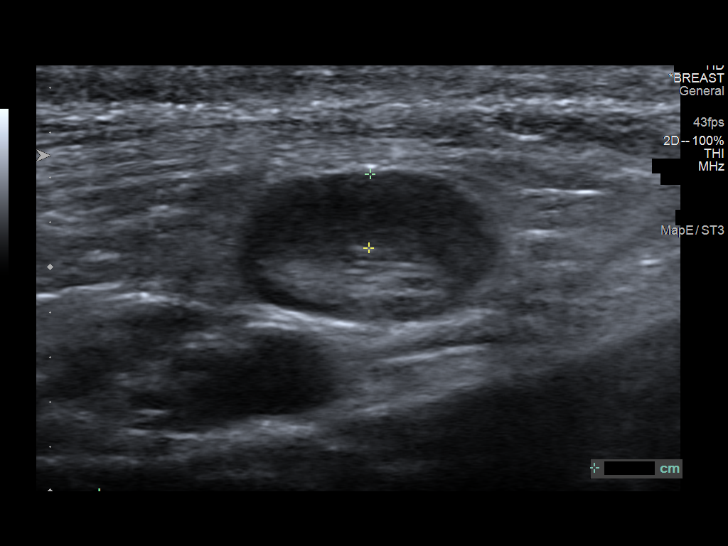

[11 of 11 positions shown; findings below may reference images not displayed]

FINDINGS: On physical exam,no discrete mass is palpated along the
anterolateral left ribs.

Ultrasound is performed, showing normal sized, raw normal morphology
lymph nodes. Several have prominent cortices measuring up to 3.8 mm
in thickness, all being stable compared to the prior ultrasound.

Sonographic evaluation between left anterolateral ribs, in the
location does at night in by the patient, demonstrates normal
tissue. No mass.
IMPRESSION: 1. Benign/reactive left axillary lymph nodes, unchanged since
06/29/2021.

RECOMMENDATION:
1. If left axillary lymph nodes appear to be enlarging on palpation,
repeat sonography would be recommended

I have discussed the findings and recommendations with the patient.
If applicable, a reminder letter will be sent to the patient
regarding the next appointment.

BI-RADS CATEGORY  2: Benign.

## 2022-01-29 ENCOUNTER — Encounter: Payer: 59 | Admitting: Internal Medicine

## 2022-01-29 ENCOUNTER — Encounter: Payer: 59 | Admitting: Gastroenterology

## 2022-02-08 ENCOUNTER — Encounter: Payer: 59 | Admitting: Internal Medicine

## 2022-03-01 ENCOUNTER — Ambulatory Visit (AMBULATORY_SURGERY_CENTER): Payer: 59 | Admitting: *Deleted

## 2022-03-01 VITALS — Ht 70.0 in | Wt 175.0 lb

## 2022-03-01 DIAGNOSIS — Z1211 Encounter for screening for malignant neoplasm of colon: Secondary | ICD-10-CM

## 2022-03-01 NOTE — Progress Notes (Signed)
No egg or soy allergy known to patient  ?No issues known to pt with past sedation with any surgeries or procedures ?Patient denies ever being told they had issues or difficulty with intubation  ?No FH of Malignant Hyperthermia ?Pt is not on diet pills ?Pt is not on  home 02  ?Pt is not on blood thinners  ?Pt denies issues with constipation  ?No A fib or A flutter ?Pt had Suprep at home, had colon scheduled previously and had to cancel. ? ?PV completed over the phone. Pt verified name, DOB, address and insurance during PV today.  ?Pt mailed instruction packet with copy of consent form to read and not return, and instructions.  ?Pt encouraged to call with questions or issues.  ?If pt has My chart, procedure instructions sent via My Chart   ?

## 2022-03-19 ENCOUNTER — Encounter: Payer: Self-pay | Admitting: Family Medicine

## 2022-03-19 MED ORDER — PREDNISONE 20 MG PO TABS: ORAL_TABLET | ORAL | 0 refills | Status: DC

## 2022-03-19 NOTE — Telephone Encounter (Signed)
Patient called in and states he is [place] right now and is heading home this weekend. Would like to see if Dr.Hunter could send anything in for him. He leaves again on Sunday. Please advise.  ?

## 2022-03-22 ENCOUNTER — Encounter: Payer: 59 | Admitting: Internal Medicine

## 2022-03-25 ENCOUNTER — Encounter: Payer: Self-pay | Admitting: Internal Medicine

## 2022-04-01 ENCOUNTER — Encounter: Payer: Self-pay | Admitting: Internal Medicine

## 2022-04-01 ENCOUNTER — Ambulatory Visit (AMBULATORY_SURGERY_CENTER): Payer: 59 | Admitting: Internal Medicine

## 2022-04-01 VITALS — BP 111/65 | HR 45 | Temp 97.7°F | Resp 9 | Ht 70.0 in | Wt 175.0 lb

## 2022-04-01 DIAGNOSIS — Z1211 Encounter for screening for malignant neoplasm of colon: Secondary | ICD-10-CM

## 2022-04-01 DIAGNOSIS — D123 Benign neoplasm of transverse colon: Secondary | ICD-10-CM

## 2022-04-01 DIAGNOSIS — D122 Benign neoplasm of ascending colon: Secondary | ICD-10-CM | POA: Diagnosis not present

## 2022-04-01 MED ORDER — SODIUM CHLORIDE 0.9 % IV SOLN: 500.0000 mL | Freq: Once | INTRAVENOUS | Status: DC

## 2022-04-01 NOTE — Op Note (Signed)
[facility] Patient Name: Patrick Clayton Procedure Date: 04/01/2022 9:30 AM MRN: [id number] Endoscopist: Jerene Bears , MD Age: 46 Referring MD:  Date of Birth: 1975/12/23 Gender: Male Account #: [id number] Procedure:                Colonoscopy Indications:              Screening for colorectal malignant neoplasm, This                            is the patient's first colonoscopy Medicines:                Monitored Anesthesia Care Procedure:                Pre-Anesthesia Assessment:                           - Prior to the procedure, a History and Physical                            was performed, and patient medications and                            allergies were reviewed. The patient's tolerance of                            previous anesthesia was also reviewed. The risks                            and benefits of the procedure and the sedation                            options and risks were discussed with the patient.                            All questions were answered, and informed consent                            was obtained. Prior Anticoagulants: The patient has                            taken no previous anticoagulant or antiplatelet                            agents. ASA Grade Assessment: I - A normal, healthy                            patient. After reviewing the risks and benefits,                            the patient was deemed in satisfactory condition to                            undergo the procedure.  After obtaining informed consent, the colonoscope                            was passed under direct vision. Throughout the                            procedure, the patient's blood pressure, pulse, and                            oxygen saturations were monitored continuously. The                            Olympus CF-HQ190L (Serial# [id number]) Colonoscope was                            introduced through the anus and advanced  to the                            cecum, identified by appendiceal orifice and                            ileocecal valve. The colonoscopy was performed                            without difficulty. The patient tolerated the                            procedure well. The quality of the bowel                            preparation was excellent. The ileocecal valve,                            appendiceal orifice, and rectum were photographed. Scope In: 9:38:00 AM Scope Out: 9:53:53 AM Scope Withdrawal Time: 0 hours 12 minutes 25 seconds  Total Procedure Duration: 0 hours 15 minutes 53 seconds  Findings:                 The digital rectal exam was normal.                           Two sessile polyps were found in the ascending                            colon. The polyps were 2 to 6 mm in size. These                            polyps were removed with a cold snare. Resection                            and retrieval were complete.                           A 5 mm polyp was found in the transverse colon.  The                            polyp was sessile. The polyp was removed with a                            cold snare. Resection and retrieval were complete.                           A few small-mouthed diverticula were found in the                            sigmoid colon.                           Internal hemorrhoids were found during                            retroflexion. The hemorrhoids were medium-sized. Complications:            No immediate complications. Estimated Blood Loss:     Estimated blood loss was minimal. Impression:               - Two 2 to 6 mm polyps in the ascending colon,                            removed with a cold snare. Resected and retrieved.                           - One 5 mm polyp in the transverse colon, removed                            with a cold snare. Resected and retrieved.                           - Diverticulosis in the sigmoid colon.                            - Internal hemorrhoids. Recommendation:           - Patient has a contact number available for                            emergencies. The signs and symptoms of potential                            delayed complications were discussed with the                            patient. Return to normal activities tomorrow.                            Written discharge instructions were provided to the                            patient.                           -  Resume previous diet.                           - Continue present medications.                           - Await pathology results.                           - Repeat colonoscopy is recommended. The                            colonoscopy date will be determined after pathology                            results from today's exam become available for                            review. Jerene Bears, MD 04/01/2022 9:57:12 AM This report has been signed electronically.

## 2022-04-01 NOTE — Patient Instructions (Signed)
Handouts given for polyps and diverticulosis.   YOU HAD AN ENDOSCOPIC PROCEDURE TODAY AT [facility]:   Refer to the procedure report that was given to you for any specific questions about what was found during the examination.  If the procedure report does not answer your questions, please call your gastroenterologist to clarify.  If you requested that your care partner not be given the details of your procedure findings, then the procedure report has been included in a sealed envelope for you to review at your convenience later.  YOU SHOULD EXPECT: Some feelings of bloating in the abdomen. Passage of more gas than usual.  Walking can help get rid of the air that was put into your GI tract during the procedure and reduce the bloating. If you had a lower endoscopy (such as a colonoscopy or flexible sigmoidoscopy) you may notice spotting of blood in your stool or on the toilet paper. If you underwent a bowel prep for your procedure, you may not have a normal bowel movement for a few days.  Please Note:  You might notice some irritation and congestion in your nose or some drainage.  This is from the oxygen used during your procedure.  There is no need for concern and it should clear up in a day or so.  SYMPTOMS TO REPORT IMMEDIATELY:  Following lower endoscopy (colonoscopy):  Excessive amounts of blood in the stool  Significant tenderness or worsening of abdominal pains  Swelling of the abdomen that is new, acute  Fever of 100F or higher  For urgent or emergent issues, a gastroenterologist can be reached at any hour by calling [phone]. Do not use MyChart messaging for urgent concerns.    DIET:  We do recommend a small meal at first, but then you may proceed to your regular diet.  Drink plenty of fluids but you should avoid alcoholic beverages for 24 hours.  ACTIVITY:  You should plan to take it easy for the rest of today and you should NOT DRIVE or use heavy machinery  until tomorrow (because of the sedation medicines used during the test).    FOLLOW UP: Our staff will call the number listed on your records 48-72 hours following your procedure to check on you and address any questions or concerns that you may have regarding the information given to you following your procedure. If we do not reach you, we will leave a message.  We will attempt to reach you two times.  During this call, we will ask if you have developed any symptoms of COVID 19. If you develop any symptoms (ie: fever, flu-like symptoms, shortness of breath, cough etc.) before then, please call [phone].  If you test positive for Covid 19 in the 2 weeks post procedure, please call and report this information to [place].    If any biopsies were taken you will be contacted by phone or by letter within the next 1-3 weeks.  Please call us at [phone] if you have not heard about the biopsies in 3 weeks.    SIGNATURES/CONFIDENTIALITY: You and/or your care partner have signed paperwork which will be entered into your electronic medical record.  These signatures attest to the fact that that the information above on your After Visit Summary has been reviewed and is understood.  Full responsibility of the confidentiality of this discharge information lies with you and/or your care-partner.

## 2022-04-01 NOTE — Progress Notes (Signed)
Called to room to assist during endoscopic procedure.  Patient ID and intended procedure confirmed with present staff. Received instructions for my participation in the procedure from the performing physician.  

## 2022-04-01 NOTE — Progress Notes (Signed)
Pt's states no medical or surgical changes since previsit or office visit. 

## 2022-04-01 NOTE — Progress Notes (Signed)
GASTROENTEROLOGY PROCEDURE H&P NOTE   Primary Care Physician: Marin Olp, MD    Reason for Procedure:  Colon cancer screening  Plan:    Colonoscopy  Patient is appropriate for endoscopic procedure(s) in the ambulatory ([place]) setting.  The nature of the procedure, as well as the risks, benefits, and alternatives were carefully and thoroughly reviewed with the patient. Ample time for discussion and questions allowed. The patient understood, was satisfied, and agreed to proceed.     HPI: Patrick Clayton is a 46 y.o. male who presents for screening colonoscopy.  Medical history as below.  Tolerated the prep.  No recent chest pain or shortness of breath.  No abdominal pain today.  Past Medical History:  Diagnosis Date   Hyperlipidemia    Insomnia     Past Surgical History:  Procedure Laterality Date   HYDROCELE EXCISION / REPAIR     LUMBAR LAMINECTOMY/DECOMPRESSION MICRODISCECTOMY Left 12/22/2017   Procedure: LEFT LUMBAR FIVE-SACRAL ONE  LAMINECTOMY and DISCECTOMY;  Surgeon: Ashok Pall, MD;  Location: [facility];  Service: Neurosurgery;  Laterality: Left;   SHOULDER ARTHROSCOPY Left    TENDON REPAIR     finger    Prior to Admission medications   Medication Sig Start Date End Date Taking? Authorizing Provider  multivitamin (ONE-A-DAY MEN'S) TABS tablet Take 1 tablet by mouth daily.   Yes [provider]  predniSONE (DELTASONE) 20 MG tablet Take 2 pills for 3 days, 1 pill for 3 days, 1/2 pill for 3 days, 1/2 pill every other day until finished 03/19/22  Yes Marin Olp, MD  naproxen sodium (ALEVE) 220 MG tablet Take 220 mg by mouth.    [provider]  zolpidem (AMBIEN) 5 MG tablet Take 1 tablet (5 mg total) by mouth at bedtime as needed for sleep (not drive for 8 hours after taking.). 09/08/21   Marin Olp, MD    Current Outpatient Medications  Medication Sig Dispense Refill   multivitamin (ONE-A-DAY MEN'S) TABS tablet Take 1 tablet by  mouth daily.     predniSONE (DELTASONE) 20 MG tablet Take 2 pills for 3 days, 1 pill for 3 days, 1/2 pill for 3 days, 1/2 pill every other day until finished 12 tablet 0   naproxen sodium (ALEVE) 220 MG tablet Take 220 mg by mouth.     zolpidem (AMBIEN) 5 MG tablet Take 1 tablet (5 mg total) by mouth at bedtime as needed for sleep (not drive for 8 hours after taking.). 15 tablet 1   Current Facility-Administered Medications  Medication Dose Route Frequency Provider Last Rate Last Admin   0.9 %  sodium chloride infusion  500 mL Intravenous Once Brynnlie Unterreiner, Lajuan Lines, MD        Allergies as of 04/01/2022   (No Known Allergies)    Family History  Problem Relation Age of Onset   Healthy Mother    Pancreatic cancer Father        age 4   Atrial fibrillation Father    Healthy Sister    Breast cancer Paternal Grandmother        died in 80s   Colon cancer Neg Hx    Esophageal cancer Neg Hx    Stomach cancer Neg Hx    Rectal cancer Neg Hx    Colon polyps Neg Hx     Social History   Socioeconomic History   Marital status: Married    Spouse name: Not on file   Number of children: Not on  file   Years of education: Not on file   Highest education level: Not on file  Occupational History   Not on file  Tobacco Use   Smoking status: Never   Smokeless tobacco: Never  Vaping Use   Vaping Use: Never used  Substance and Sexual Activity   Alcohol use: Yes    Alcohol/week: 6.0 standard drinks    Types: 6 Cans of beer per week   Drug use: No   Sexual activity: Not on file  Other Topics Concern   Not on file  Social History Narrative   Married. 3 kids 57, 8, 14 in 2022[employer] [employer] [facility] [employer].       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      Hobbies: f3, enjoys running including palmetto, back packing, college football [employer]   Social Determinants of Health   Financial Resource Strain: Not on file  Food Insecurity: Not on file   Transportation Needs: Not on file  Physical Activity: Not on file  Stress: Not on file  Social Connections: Not on file  Intimate Partner Violence: Not on file    Physical Exam: Vital signs in last 24 hours: '@BP'$  110/68   Pulse (!) 50   Temp 97.7 F (36.5 C)   Ht '5\' 10"'$  (1.778 m)   Wt 175 lb (79.4 kg)   SpO2 100%   BMI 25.11 kg/m  GEN: NAD EYE: Sclerae anicteric ENT: MMM CV: Non-tachycardic Pulm: CTA b/l GI: Soft, NT/ND NEURO:  Alert & Oriented x 3   Zenovia Jarred, MD [facility]  04/01/2022 9:30 AM

## 2022-04-01 NOTE — Progress Notes (Signed)
Report to PACU, RN, vss, BBS= Clear.  

## 2022-04-02 ENCOUNTER — Telehealth: Payer: Self-pay | Admitting: *Deleted

## 2022-04-02 ENCOUNTER — Telehealth: Payer: Self-pay

## 2022-04-02 NOTE — Telephone Encounter (Signed)
Left message on f/u call 

## 2022-04-02 NOTE — Telephone Encounter (Signed)
Left message on follow up call. 

## 2022-04-08 ENCOUNTER — Encounter: Payer: Self-pay | Admitting: Internal Medicine

## 2022-06-15 ENCOUNTER — Encounter: Payer: Self-pay | Admitting: Family Medicine

## 2022-06-16 ENCOUNTER — Ambulatory Visit: Payer: 59 | Admitting: Internal Medicine

## 2022-08-09 ENCOUNTER — Encounter: Payer: Self-pay | Admitting: *Deleted

## 2022-09-10 ENCOUNTER — Ambulatory Visit (INDEPENDENT_AMBULATORY_CARE_PROVIDER_SITE_OTHER): Payer: 59 | Admitting: Family Medicine

## 2022-09-10 ENCOUNTER — Encounter: Payer: Self-pay | Admitting: Family Medicine

## 2022-09-10 VITALS — BP 104/64 | HR 62 | Temp 97.2°F | Ht 70.0 in | Wt 169.2 lb

## 2022-09-10 DIAGNOSIS — Z125 Encounter for screening for malignant neoplasm of prostate: Secondary | ICD-10-CM

## 2022-09-10 DIAGNOSIS — E785 Hyperlipidemia, unspecified: Secondary | ICD-10-CM | POA: Diagnosis not present

## 2022-09-10 DIAGNOSIS — Z0000 Encounter for general adult medical examination without abnormal findings: Secondary | ICD-10-CM

## 2022-09-10 LAB — COMPREHENSIVE METABOLIC PANEL
ALT: 23 U/L (ref 0–53)
AST: 55 U/L — ABNORMAL HIGH (ref 0–37)
Albumin: 4.7 g/dL (ref 3.5–5.2)
Alkaline Phosphatase: 83 U/L (ref 39–117)
BUN: 25 mg/dL — ABNORMAL HIGH (ref 6–23)
CO2: 29 mEq/L (ref 19–32)
Calcium: 10.1 mg/dL (ref 8.4–10.5)
Chloride: 100 mEq/L (ref 96–112)
Creatinine, Ser: 1.27 mg/dL (ref 0.40–1.50)
GFR: 66.77 mL/min (ref >60.00)
Glucose, Bld: 85 mg/dL (ref 70–99)
Potassium: 5.2 mEq/L — ABNORMAL HIGH (ref 3.5–5.1)
Sodium: 136 mEq/L (ref 135–145)
Total Bilirubin: 0.7 mg/dL (ref 0.2–1.2)
Total Protein: 7 g/dL (ref 6.0–8.3)

## 2022-09-10 LAB — CBC WITH DIFFERENTIAL/PLATELET
Basophils Absolute: 0.1 10*3/uL (ref 0.0–0.1)
Basophils Relative: 0.9 % (ref 0.0–3.0)
Eosinophils Absolute: 0.1 10*3/uL (ref 0.0–0.7)
Eosinophils Relative: 0.9 % (ref 0.0–5.0)
HCT: 45 % (ref 39.0–52.0)
Hemoglobin: 15.5 g/dL (ref 13.0–17.0)
Lymphocytes Relative: 19.9 % (ref 12.0–46.0)
Lymphs Abs: 1.3 10*3/uL (ref 0.7–4.0)
MCHC: 34.4 g/dL (ref 30.0–36.0)
MCV: 96.4 fl (ref 78.0–100.0)
Monocytes Absolute: 0.5 10*3/uL (ref 0.1–1.0)
Monocytes Relative: 7.3 % (ref 3.0–12.0)
Neutro Abs: 4.7 10*3/uL (ref 1.4–7.7)
Neutrophils Relative %: 71 % (ref 43.0–77.0)
Platelets: 216 10*3/uL (ref 150.0–400.0)
RBC: 4.66 Mil/uL (ref 4.22–5.81)
RDW: 12.7 % (ref 11.5–15.5)
WBC: 6.6 10*3/uL (ref 4.0–10.5)

## 2022-09-10 LAB — LIPID PANEL
Cholesterol: 196 mg/dL (ref 0–200)
HDL: 65.3 mg/dL (ref >39.00)
LDL Cholesterol: 113 mg/dL — ABNORMAL HIGH (ref 0–99)
NonHDL: 131.08
Total CHOL/HDL Ratio: 3
Triglycerides: 88 mg/dL (ref 0.0–149.0)
VLDL: 17.6 mg/dL (ref 0.0–40.0)

## 2022-09-10 LAB — PSA: PSA: 0.23 ng/mL (ref 0.10–4.00)

## 2022-09-10 MED ORDER — ZOLPIDEM TARTRATE 5 MG PO TABS: 5.0000 mg | ORAL_TABLET | Freq: Every evening | ORAL | 1 refills | Status: DC | PRN

## 2022-09-10 NOTE — Patient Instructions (Addendum)
Thanks for doing flu shot  -if ear symptoms recur or worsen or have pain- he will let me know- consider augmentin for otitis media/ear infection  -let me know if arm symptoms worsen  Please stop by lab before you go If you have mychart- we will send your results within 3 business days of [place] receiving them.  If you do not have mychart- we will call you about results within 5 business days of [place] receiving them.  *please also note that you will see labs on mychart as soon as they post. I will later go in and write notes on them- will say "notes from Dr. Yong Channel"   Recommended follow up: Return in about 1 year (around 09/11/2023) for physical or sooner if needed.Schedule b4 you leave.

## 2022-09-10 NOTE — Progress Notes (Signed)
Phone: [phone]    Subjective:  Patient presents today for their annual physical. Chief complaint-noted.   See problem oriented charting- ROS- full  review of systems was completed and negative  except for: recent cold- finishing up- had fever, congestion, PND, runny nose, sinus pressure, cough- all improving  The following were reviewed and entered/updated in epic: Past Medical History:  Diagnosis Date   Hyperlipidemia    Insomnia    Patient Active Problem List   Diagnosis Date Noted   HNP (herniated nucleus pulposus), lumbar 12/21/2017    Priority: Low   Left anterior knee pain 08/10/2016    Priority: Low   Posterior left knee pain 05/27/2015    Priority: Low   Insomnia 09/08/2021   Hyperlipidemia 09/08/2021   Past Surgical History:  Procedure Laterality Date   HYDROCELE EXCISION / REPAIR     LUMBAR LAMINECTOMY/DECOMPRESSION MICRODISCECTOMY Left 12/22/2017   Procedure: LEFT LUMBAR FIVE-SACRAL ONE  LAMINECTOMY and DISCECTOMY;  Surgeon: Ashok Pall, MD;  Location: [place];  Service: Neurosurgery;  Laterality: Left;   SHOULDER ARTHROSCOPY Left    TENDON REPAIR     finger    Family History  Problem Relation Age of Onset   Healthy Mother    Pancreatic cancer Father        age 68   Atrial fibrillation Father        85s   Healthy Sister    Breast cancer Paternal Grandmother        died in 47s   Colon cancer Neg Hx    Esophageal cancer Neg Hx    Stomach cancer Neg Hx    Rectal cancer Neg Hx    Colon polyps Neg Hx     Medications- reviewed and updated Current Outpatient Medications  Medication Sig Dispense Refill   multivitamin (ONE-A-DAY MEN'S) TABS tablet Take 1 tablet by mouth daily.     naproxen sodium (ALEVE) 220 MG tablet Take 220 mg by mouth.     zolpidem (AMBIEN) 5 MG tablet Take 1 tablet (5 mg total) by mouth at bedtime as needed for sleep (not drive for 8 hours after taking.). 15 tablet 1   No current facility-administered medications for this  visit.    Allergies-reviewed and updated No Known Allergies  Social History   Social History Narrative   Married. 3 kids 26, 43, 14 in 2022[facility] academy.       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 Oropharynx normal, TM normal on the right, on left mild erythema  Neck: no thyromegaly CV: RRR no murmurs rubs or gallops Lungs: CTAB no crackles, wheeze, rhonchi Abdomen: soft/nontender/nondistended/normal bowel sounds. No rebound or guarding.  Ext: no edema Skin: warm, dry Neuro: grossly normal- equal upper extremity strength moves all extremities, PERRLA Msk: mild fasiculations in left calf- do not see actively in left arm at moment     Assessment and Plan:  46 y.o. male presenting for annual physical.  Health Maintenance counseling: 1. Anticipatory guidance: Patient counseled regarding regular dental exams -q6 months, eye exams -  no recent issues- just has readers,  avoiding smoking and second hand smoke, limiting alcohol to 2 beverages per day- about 5 a week, no illicit drugs.   2. Risk factor reduction:  Advised patient of need for regular exercise and diet rich and fruits and vegetables to reduce risk of heart attack and stroke.  Exercise- excellent about 4-5 days a week even despite travel- runs help recover from jet lag.  Diet/weight management-very healthy weight.  Wt Readings from Last 3 Encounters:  09/10/22 169 lb 3.2 oz (76.7 kg)  04/01/22 175 lb (79.4 kg)  03/01/22 175 lb (79.4 kg)  3. Immunizations/screenings/ancillary studies- flu shot today, holding off on covid Immunization History   Administered Date(s) Administered   Influenza,inj,Quad PF,6+ Mos 09/08/2021   PFIZER(Purple Top)SARS-COV-2 Vaccination 01/19/2020, 02/06/2020, 11/15/2020   Rabies, IM 05/09/2014, 05/12/2014, 05/16/2014   Tdap 11/15/2017  4. Prostate cancer screening-  has had PSA with [facility] in past- but I do not see in records. No family history of prostate cancer - he prefers to do PSA earlier than 55 5. Colon cancer screening - may 2023 with 2 precancerous polyps- 5 year repeat needed with Dr. Hilarie Fredrickson 6. Skin cancer screening/prevention- prn dermatology visits.- no recent checks. advised regular sunscreen use. Denies worrisome, changing, or new skin lesions.  7. Testicular cancer screening- advised monthly self exams - no concerns 8. STD screening- patient opts out- only active with [facility] 9. Smoking associated screening- Never smoker  Status of chronic or acute concerns   #Last year discussed intermittent left axillary lymphadenopathy- largely reassuring ultrasound and mammogram- 1 lymph node appeared to be resolving reactive lymph node- they recommended 3 month repeat- unchanged at repeat and no issues since that time- has gone back down -we even wonder if could have been latent with covid shot- tended to get heavy axillary swelling with shots  #history of discectomy 4 years ago L5-L1 -yoga and decompression exercises with intermittent aleve (tries to skip if can). [facility] method PT set up through [facility]. Chiropractor Dr. Susie Cassette every 6 weeks for maintenance   -manageable -check CMP to eval kidney functoin  #insomnia- ambien with international travel- will provide refill  #hyperlipidemia S: Medication:none -last LDL 105  A/P: update lipids and calculate ascvd risk- and consider ct calcium scoring  #Left arm twitching- has noted more involuntary spasms in left lower arm for 4 months- not noted anywhere else on the left side (other than chronic twitching left calf after discectomy) in 2019.  Not positional  in general but perhaps slightly more when seated. Not more common after heavier workout or after long day. No upper back pain or neck pain. Somedays not at all but never seens clear trigger. No weakness in arm. No cramping -does take MV -fasciculations left arm unclear cause- offered neuro referral to consider EMG- he wants to monitor for now unless worsens further  #Left ear - even prior to respiratory infection- was hearing a clicking/crinkling noise with bending over and coming back up- actually slightly better after URI -if ear symptoms recur or worsen or have pain- he will let me know- consider augmentin for otitis media/ear infection  Recommended follow up: Return in about 1 year (around 09/11/2023) for physical or sooner if needed.Schedule b4 you leave.  Lab/Order associations: fasting   ICD-10-CM   1. Preventative health care  Z00.00 CBC with Differential/Platelet    Comprehensive metabolic panel    Lipid panel    PSA    2. Mild hyperlipidemia  E78.5 Lipid panel    3. Screening for prostate cancer  Z12.5 PSA      Meds ordered this encounter  Medications   zolpidem (AMBIEN) 5 MG tablet    Sig: Take 1 tablet (5 mg total) by mouth at bedtime as needed for sleep (not drive for 8 hours after taking.).    Dispense:  15 tablet    Refill:  1    Return precautions advised.   Garret Reddish, MD

## 2023-03-16 ENCOUNTER — Encounter: Payer: Self-pay | Admitting: Family Medicine

## 2023-04-15 ENCOUNTER — Other Ambulatory Visit: Payer: Self-pay

## 2023-04-15 ENCOUNTER — Telehealth: Payer: Self-pay | Admitting: Family Medicine

## 2023-04-15 MED ORDER — ZOLPIDEM TARTRATE 5 MG PO TABS: 5.0000 mg | ORAL_TABLET | Freq: Every evening | ORAL | 1 refills | Status: DC | PRN

## 2023-04-15 NOTE — Telephone Encounter (Signed)
Last refill: 09/10/22 #15, 0 Last OV: 09/10/22 dx. CPE

## 2023-04-15 NOTE — Telephone Encounter (Signed)
Refill request sent to PCP.

## 2023-04-15 NOTE — Telephone Encounter (Signed)
Prescription Request  04/15/2023  LOV: 09/10/2022  What is the name of the medication or equipment? zolpidem (AMBIEN) 5 MG tablet   Have you contacted your pharmacy to request a refill? Yes   Which pharmacy would you like this sent to?   [id number] [place], [place] - [address] [place] [address] Phone: [phone] Fax: [phone]    Patient notified that their request is being sent to the clinical staff for review and that they should receive a response within 2 business days.   Please advise at Mobile [phone] (mobile)

## 2023-09-13 ENCOUNTER — Encounter: Payer: 59 | Admitting: Family Medicine

## 2023-11-21 ENCOUNTER — Encounter: Payer: 59 | Admitting: Family Medicine

## 2023-11-24 ENCOUNTER — Encounter: Payer: Self-pay | Admitting: Family Medicine

## 2023-11-28 ENCOUNTER — Ambulatory Visit (INDEPENDENT_AMBULATORY_CARE_PROVIDER_SITE_OTHER): Payer: 59 | Admitting: Family Medicine

## 2023-11-28 ENCOUNTER — Encounter: Payer: Self-pay | Admitting: Family Medicine

## 2023-11-28 VITALS — BP 100/64 | HR 89 | Temp 97.9°F | Ht 70.0 in | Wt 174.4 lb

## 2023-11-28 DIAGNOSIS — Z125 Encounter for screening for malignant neoplasm of prostate: Secondary | ICD-10-CM | POA: Diagnosis not present

## 2023-11-28 DIAGNOSIS — Z23 Encounter for immunization: Secondary | ICD-10-CM | POA: Diagnosis not present

## 2023-11-28 DIAGNOSIS — E663 Overweight: Secondary | ICD-10-CM | POA: Diagnosis not present

## 2023-11-28 DIAGNOSIS — Z131 Encounter for screening for diabetes mellitus: Secondary | ICD-10-CM

## 2023-11-28 DIAGNOSIS — Z0000 Encounter for general adult medical examination without abnormal findings: Secondary | ICD-10-CM

## 2023-11-28 DIAGNOSIS — E785 Hyperlipidemia, unspecified: Secondary | ICD-10-CM

## 2023-11-28 LAB — URINALYSIS, ROUTINE W REFLEX MICROSCOPIC
Bilirubin Urine: NEGATIVE
Hgb urine dipstick: NEGATIVE
Ketones, ur: NEGATIVE
Nitrite: NEGATIVE
RBC / HPF: NONE SEEN (ref 0–?)
Specific Gravity, Urine: 1.015 (ref 1.000–1.030)
Total Protein, Urine: NEGATIVE
Urine Glucose: NEGATIVE
Urobilinogen, UA: 0.2 (ref 0.0–1.0)
pH: 6 (ref 5.0–8.0)

## 2023-11-28 LAB — LIPID PANEL
Cholesterol: 214 mg/dL — ABNORMAL HIGH (ref 0–200)
HDL: 62.6 mg/dL (ref >39.00)
LDL Cholesterol: 130 mg/dL — ABNORMAL HIGH (ref 0–99)
NonHDL: 151.14
Total CHOL/HDL Ratio: 3
Triglycerides: 107 mg/dL (ref 0.0–149.0)
VLDL: 21.4 mg/dL (ref 0.0–40.0)

## 2023-11-28 LAB — CBC WITH DIFFERENTIAL/PLATELET
Basophils Absolute: 0.1 10*3/uL (ref 0.0–0.1)
Basophils Relative: 0.6 % (ref 0.0–3.0)
Eosinophils Absolute: 0 10*3/uL (ref 0.0–0.7)
Eosinophils Relative: 0.5 % (ref 0.0–5.0)
HCT: 44.9 % (ref 39.0–52.0)
Hemoglobin: 15.5 g/dL (ref 13.0–17.0)
Lymphocytes Relative: 20 % (ref 12.0–46.0)
Lymphs Abs: 1.6 10*3/uL (ref 0.7–4.0)
MCHC: 34.4 g/dL (ref 30.0–36.0)
MCV: 97.1 fL (ref 78.0–100.0)
Monocytes Absolute: 0.5 10*3/uL (ref 0.1–1.0)
Monocytes Relative: 6.3 % (ref 3.0–12.0)
Neutro Abs: 5.7 10*3/uL (ref 1.4–7.7)
Neutrophils Relative %: 72.6 % (ref 43.0–77.0)
Platelets: 248 10*3/uL (ref 150.0–400.0)
RBC: 4.63 Mil/uL (ref 4.22–5.81)
RDW: 12.7 % (ref 11.5–15.5)
WBC: 7.8 10*3/uL (ref 4.0–10.5)

## 2023-11-28 LAB — COMPREHENSIVE METABOLIC PANEL
ALT: 23 U/L (ref 0–53)
AST: 34 U/L (ref 0–37)
Albumin: 4.8 g/dL (ref 3.5–5.2)
Alkaline Phosphatase: 80 U/L (ref 39–117)
BUN: 26 mg/dL — ABNORMAL HIGH (ref 6–23)
CO2: 27 meq/L (ref 19–32)
Calcium: 9.5 mg/dL (ref 8.4–10.5)
Chloride: 101 meq/L (ref 96–112)
Creatinine, Ser: 1.2 mg/dL (ref 0.40–1.50)
GFR: 70.86 mL/min (ref >60.00)
Glucose, Bld: 91 mg/dL (ref 70–99)
Potassium: 4.4 meq/L (ref 3.5–5.1)
Sodium: 136 meq/L (ref 135–145)
Total Bilirubin: 0.9 mg/dL (ref 0.2–1.2)
Total Protein: 7 g/dL (ref 6.0–8.3)

## 2023-11-28 LAB — HEMOGLOBIN A1C: Hgb A1c MFr Bld: 5.4 % (ref 4.6–6.5)

## 2023-11-28 LAB — PSA: PSA: 0.42 ng/mL (ref 0.10–4.00)

## 2023-11-28 NOTE — Progress Notes (Signed)
 Phone: [phone]   Subjective:  Patient presents today for their annual physical. Chief complaint-noted.   See problem oriented charting- ROS- full  review of systems was completed and negative  Per full ROS sheet completed by patient- other than recent cold 2 weeks ago- on tail end of that- mucinex dm about all he needed  The following were reviewed and entered/updated in epic: Past Medical History:  Diagnosis Date   Hyperlipidemia    Insomnia    Patient Active Problem List   Diagnosis Date Noted   HNP (herniated nucleus pulposus), lumbar 12/21/2017    Priority: Low   Left anterior knee pain 08/10/2016    Priority: Low   Posterior left knee pain 05/27/2015    Priority: Low   Insomnia 09/08/2021   Hyperlipidemia 09/08/2021   Past Surgical History:  Procedure Laterality Date   HYDROCELE EXCISION / REPAIR     LUMBAR LAMINECTOMY/DECOMPRESSION MICRODISCECTOMY Left 12/22/2017   Procedure: LEFT LUMBAR FIVE-SACRAL ONE  LAMINECTOMY and DISCECTOMY;  Surgeon: Gillie Duncans, MD;  [facility];  Service: Neurosurgery;  Laterality: Left;   SHOULDER ARTHROSCOPY Left    TENDON REPAIR     finger    Family History  Problem Relation Age of Onset   Healthy Mother    Pancreatic cancer Father        age 37   Atrial fibrillation Father        69s   Throat cancer Sister        62 never smoker nor exposure. chemo/radiation- doing ok   Breast cancer Paternal Grandmother        died in 76s   Colon cancer Neg Hx    Esophageal cancer Neg Hx    Stomach cancer Neg Hx    Rectal cancer Neg Hx    Colon polyps Neg Hx     Medications- reviewed and updated Current Outpatient Medications  Medication Sig Dispense Refill   multivitamin (ONE-A-DAY MEN'S) TABS tablet Take 1 tablet by mouth daily.     naproxen  sodium (ALEVE ) 220 MG tablet Take 220 mg by mouth. (Patient not taking: Reported on 11/28/2023)     zolpidem  (AMBIEN ) 5 MG tablet Take 1 tablet (5 mg total) by mouth at bedtime as  needed for sleep (do not drive for 8 hours after taking.). (Patient not taking: Reported on 11/28/2023) 15 tablet 1   No current facility-administered medications for this visit.    Allergies-reviewed and updated No Known Allergies  Social History   Social History Narrative   Married. 3 kids 10, 12, 14 in 2022[facility] [employer].       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 Oropharynx normal Neck: no thyromegaly CV: RRR no murmurs rubs or gallops Lungs: CTAB no crackles, wheeze, rhonchi Abdomen: soft/nontender/nondistended/normal bowel sounds. No rebound or guarding.  Ext: no edema Skin: warm, dry Neuro: grossly normal, moves all extremities, PERRLA External hemorrhoid tag noted-     Assessment and Plan  Patrick y.o. male presenting for annual physical.  Health Maintenance counseling: 1. Anticipatory guidance: Patient  counseled regarding regular dental exams -q6 months, eye exams - may get baseline around Patrick,  avoiding smoking and second hand smoke, limiting alcohol to 2 beverages per day - 10 beers a week, no illicit drugs.   2. Risk factor reduction:  Advised patient of need for regular exercise and diet rich and fruits and vegetables to reduce risk of heart attack and stroke.  Exercise-5 days a week still typically.  Diet/weight management-very healthy weight when factoring in his muscle mass. Does intermittent fasting starting about 4 years ago.  Wt Readings from Last 3 Encounters:  11/28/23 174 lb 6.4 oz (79.1 kg)  09/10/22 169 lb 3.2 oz (76.7 kg)  04/01/22 175 lb (79.4 kg)  3.  Immunizations/screenings/ancillary studies-holding off on COVID-19 vaccination, discussed Shingrix at age 4 if desired  Immunization History  Administered Date(s) Administered   Influenza, Seasonal, Injecte, Preservative Fre 11/28/2023   Influenza,inj,Quad PF,6+ Mos 09/08/2021, 09/10/2022   PFIZER(Purple Top)SARS-COV-2 Vaccination 01/19/2020, 02/06/2020, 11/15/2020   Rabies, IM 05/09/2014, 05/12/2014, 05/16/2014   Tdap 11/15/2017  4. Prostate cancer screening- no family history of prostate cancer but he prefers to do PSAs early-will check again with labs today. More frequency and urgency with age Lab Results  Component Value Date   PSA 0.23 09/10/2022   5. Colon cancer screening - May 2023 with 2 precancerous polyps with plan for 5-year repeat 6. Skin cancer screening-as needed dermatology visits- has visit on Wednesday for spot on forehead. advised regular sunscreen use.  7. Smoking associated screening (lung cancer screening, AAA screen 65-75, UA)-never smoker 8. STD screening -only active with wife  Status of chronic or acute concerns   #hyperlipidemia S: Medication: None The 10-year ASCVD risk score (Arnett DK, et al., 2019) is: 1.5%  -no coronary artery disease history in family- dad with a fib  Lab Results  Component Value Date   CHOL 196 09/10/2022   HDL 65.30 09/10/2022   LDLCALC 113 (H) 09/10/2022   TRIG 88.0 09/10/2022   CHOLHDL 3 09/10/2022   A/P: lipids mildly elevated- hed like to be proactive and get baseline CT cardiac scoring- ordered this today  #overweight/screen diabetes- Im not concerned about his weight with his muscle mass but will use BMI to screen with a1c  Hemorrhoid-has tried Preparation H and another topical without relief.  He has had some discomfort in this area- has noted this one externally for about 2 years that comes and goes -well hydrated, no constipation   -no history anal sex  -appears to be tag only- we discussed possible surgery referral  with decent size but wants to hold off for now - will contact me if changes mind   # History of discectomy about 5 years ago L5 L1-yoga and decompression exercises with intermittent Aleve  if it flares up.  Still seeing Dr. Alm Albino chiropractor for maintenance- about every 6 weeks - already does turmeric  # International travel-sparing use of Ambien    #he may consider D[facility] at [facility]   Recommended follow up: Return in about 1 year (around 11/27/2024) for physical or sooner if needed.Schedule b4 you leave.  Lab/Order associations: fasting   ICD-10-CM   1. Preventative health care  Z00.00     2. Need for influenza vaccination  Z23 Flu vaccine trivalent PF, 6mos and older(Flulaval,Afluria,Fluarix,Fluzone)    3. Hyperlipidemia, unspecified hyperlipidemia type  E78.5 Comprehensive metabolic panel    CBC with Differential/Platelet    Lipid panel    CT CARDIAC SCORING ([facility] LOCATIONS ONLY)  Urinalysis, Routine w reflex microscopic    4. Overweight  E66.3 Hemoglobin A1c    5. Screening for diabetes mellitus  Z13.1 Hemoglobin A1c    6. Screening for prostate cancer  Z12.5 PSA      No orders of the defined types were placed in this encounter.   Return precautions advised.  Garnette Lukes, MD

## 2023-11-28 NOTE — Patient Instructions (Addendum)
 Let us  know if you decide to get another COVID vaccine.  Consider shingrix at 48 here or pharmacy  Consider updating eye exam  Please stop by lab before you go If you have mychart- we will send your results within 3 business days of us  receiving them.  If you do not have mychart- we will call you about results within 5 business days of us  receiving them.  *please also note that you will see labs on mychart as soon as they post. I will later go in and write notes on them- will say notes from Dr. Katrinka   We will call you within two weeks about your referral for CT cardiac scoring through [facility].  Their phone number is [phone].  Please call them if you have not heard in 1-2 weeks   Recommended follow up: Return in about 1 year (around 11/27/2024) for physical or sooner if needed.Schedule b4 you leave.

## 2023-12-23 ENCOUNTER — Ambulatory Visit
Admission: RE | Admit: 2023-12-23 | Discharge: 2023-12-23 | Disposition: A | Discharge status: Home / Self Care | Payer: 59 | Source: Ambulatory Visit | Attending: Family Medicine | Admitting: Family Medicine

## 2023-12-23 DIAGNOSIS — E785 Hyperlipidemia, unspecified: Secondary | ICD-10-CM

## 2023-12-26 ENCOUNTER — Encounter: Payer: Self-pay | Admitting: Family Medicine

## 2023-12-26 DIAGNOSIS — E785 Hyperlipidemia, unspecified: Secondary | ICD-10-CM

## 2023-12-26 DIAGNOSIS — R931 Abnormal findings on diagnostic imaging of heart and coronary circulation: Secondary | ICD-10-CM

## 2024-07-18 ENCOUNTER — Ambulatory Visit: Payer: Self-pay | Attending: Cardiovascular Disease | Admitting: Cardiovascular Disease

## 2024-07-18 ENCOUNTER — Other Ambulatory Visit (HOSPITAL_COMMUNITY): Payer: Self-pay

## 2024-07-18 ENCOUNTER — Encounter: Payer: Self-pay | Admitting: Cardiovascular Disease

## 2024-07-18 VITALS — BP 118/74 | HR 52 | Ht 70.0 in | Wt 177.4 lb

## 2024-07-18 DIAGNOSIS — E782 Mixed hyperlipidemia: Secondary | ICD-10-CM | POA: Diagnosis not present

## 2024-07-18 MED ORDER — ROSUVASTATIN CALCIUM 10 MG PO TABS
10.0000 mg | ORAL_TABLET | Freq: Every day | ORAL | 3 refills | Status: AC
  Filled 2024-07-18: qty 30, 30d supply, fill #0
  Filled 2024-11-27: qty 30, 30d supply, fill #1

## 2024-07-18 NOTE — Patient Instructions (Addendum)
 Medication Instructions:  Your physician has recommended you make the following change in your medication:  Start rosuvastatin  10 mg daily  Lab Work: Lipo profile and Lpa -- Today Liver and lipid -- 3 months  You may go to any [facility] Location for your lab work:  [facility] - [address] ([facility]) - [address] [phone] [address]  [place] - [address]   [place]  - [address]   [place] - [address] - [address] [employer]  - [address] ([facility]   If you have labs (blood work) drawn today and your tests are completely normal, you will receive your results only by: [employer] (if you have MyChart)  If you have any lab test that is abnormal or we need to change your treatment, we will call you or send a MyChart message to review the results.  Testing/Procedures: None ordered.  Follow-Up: At [facility], you and your health needs are our priority.  As part of our continuing mission to provide you with exceptional heart care, we have created designated Provider Care Teams.  These Care Teams include your primary Cardiologist (physician) and Advanced Practice Providers (APPs -  Physician Assistants and Nurse Practitioners) who all work together to provide you with the care you need, when you need it.  Your next appointment:   1 year(s)  The format for your next appointment:   In Person  Provider:   Ozell Fell, MD

## 2024-07-18 NOTE — Progress Notes (Signed)
 Cardiology Office Note:    Date:  07/18/2024   ID:  Herlene Eke, DOB [birth year], MRN [id number]  PCP:  Katrinka Garnette KIDD, MD   [facility] Providers Cardiologist:  None     Referring MD: Katrinka Garnette KIDD, MD   Chief Complaint  Patient presents with   Hyperlipidemia    History of Present Illness:    Patrick Clayton is a 48 y.o. male presenting for evaluation of hyperlipidemia. Most recent lipids from 11/2023 show a cholesterol of 214, HDL 63, LDL 130, and trig 892.  The patient had a coronary calcium  scan performed December 26, 2003 demonstrating that he coronary calcium  score of 2 which places him in the 64th percentile for age and gender.  There were no pertinent incidental findings.  The patient is physically fit and leads a very healthy lifestyle.  He exercises vigorously without any exertional symptoms of chest pain, chest pressure, or shortness of breath.  There is no family history of premature CAD.  He is a lifelong non-smoker.  Past Medical History:  Diagnosis Date   Hyperlipidemia    Insomnia     Current Medications: Current Meds  Medication Sig   multivitamin (ONE-A-DAY MEN'S) TABS tablet Take 1 tablet by mouth daily.   rosuvastatin  (CRESTOR ) 10 MG tablet Take 1 tablet (10 mg total) by mouth daily.     Allergies:   Patient has no known allergies.   ROS:   Please see the history of present illness.    All other systems reviewed and are negative.  EKGs/Labs/Other Studies Reviewed:    The following studies were reviewed today:     EKG:   EKG Interpretation Date/Time:  Wednesday July 18 2024 09:50:06 EDT Ventricular Rate:  52 PR Interval:  198 QRS Duration:  94 QT Interval:  434 QTC Calculation: 403 R Axis:   82  Text Interpretation: Sinus bradycardia When compared with ECG of 22-Dec-2017 01:41, No significant change was found Confirmed by Wonda Sharper [phone]) on 07/18/2024 10:03:08 AM    Recent Labs: 11/28/2023: ALT 23; BUN 26;  Creatinine, Ser 1.20; Hemoglobin 15.5; Platelets 248.0; Potassium 4.4; Sodium 136  Recent Lipid Panel    Component Value Date/Time   CHOL 214 (H) 11/28/2023 1117   TRIG 107.0 11/28/2023 1117   HDL 62.60 11/28/2023 1117   CHOLHDL 3 11/28/2023 1117   VLDL 21.4 11/28/2023 1117   LDLCALC 130 (H) 11/28/2023 1117     Risk Assessment/Calculations:                Physical Exam:    VS:  BP 118/74   Pulse (!) 52   Ht 5' 10 (1.778 m)   Wt 177 lb 6.4 oz (80.5 kg)   SpO2 98%   BMI 25.45 kg/m     Wt Readings from Last 3 Encounters:  07/18/24 177 lb 6.4 oz (80.5 kg)  11/28/23 174 lb 6.4 oz (79.1 kg)  09/10/22 169 lb 3.2 oz (76.7 kg)     GEN:  Well nourished, well developed physically fit male in no acute distress HEENT: Normal NECK: No JVD; No carotid bruits LYMPHATICS: No lymphadenopathy CARDIAC: RRR, no murmurs, rubs, gallops RESPIRATORY:  Clear to auscultation without rales, wheezing or rhonchi  ABDOMEN: Soft, non-tender, non-distended MUSCULOSKELETAL:  No edema; No deformity  SKIN: Warm and dry NEUROLOGIC:  Alert and oriented x 3 PSYCHIATRIC:  Normal affect   Assessment & Plan Mixed hyperlipidemia LDL cholesterol is 130 with an elevated coronary calcium  score of only 2,  but placing him in the 64th percentile for age and gender.  We discussed pharmacologic management.  He leads an excellent lifestyle with no room for improvement in his diet and exercise regimen.  The patient will start rosuvastatin  10 mg daily with follow-up lipids and LFTs in about 3 months.  We discussed potential side effects of rosuvastatin  and he understands the need for monitoring LFTs.  He will keep an eye out for any muscle related symptoms.  I am going to check an LP(a) and a lipo med panel for advanced lipid testing to get baseline studies done as he starts on a statin drug.            Medication Adjustments/Labs and Tests Ordered: Current medicines are reviewed at length with the patient  today.  Concerns regarding medicines are outlined above.  Orders Placed This Encounter  Procedures   NMR, lipoprofile   Lipoprotein A (LPA)   Lipid panel   Hepatic function panel   EKG 12-Lead   Meds ordered this encounter  Medications   rosuvastatin  (CRESTOR ) 10 MG tablet    Sig: Take 1 tablet (10 mg total) by mouth daily.    Dispense:  90 tablet    Refill:  3    Patient Instructions  Medication Instructions:  Your physician has recommended you make the following change in your medication:  Start rosuvastatin  10 mg daily  Lab Work: Lipo profile and Lpa -- Today Liver and lipid -- 3 months  You may go to any [facility] Location for your lab work:  [facility] - [address] ([facility]) - [address] [phone] [address]  [place] - [address]   [place]  - [address]   [place] - [address] - [address] [employer]  - [address] [place] - [address] ([facility]   If you have labs (blood work) drawn today and your tests are completely normal, you will receive your results only by: [employer] (if you have MyChart)  If you have any lab test that is abnormal or we need to change your treatment, we will call you or send a MyChart message to review the results.  Testing/Procedures: None ordered.  Follow-Up: At [facility], you and your health needs are our priority.  As part of our continuing mission to provide you with exceptional heart care, we have created designated Provider Care Teams.  These Care Teams include your primary Cardiologist (physician) and Advanced Practice Providers (APPs -  Physician Assistants and Nurse Practitioners) who all work together to provide you with the care you need, when you need it.  Your next appointment:   1 year(s)  The format for your next appointment:   In Person  Provider:   Ozell Fell, MD         Signed, Ozell Fell, MD  07/18/2024 12:58 PM    [facility]

## 2024-07-18 NOTE — Assessment & Plan Note (Signed)
 LDL cholesterol is 130 with an elevated coronary calcium  score of only 2, but placing him in the 64th percentile for age and gender.  We discussed pharmacologic management.  He leads an excellent lifestyle with no room for improvement in his diet and exercise regimen.  The patient will start rosuvastatin  10 mg daily with follow-up lipids and LFTs in about 3 months.  We discussed potential side effects of rosuvastatin  and he understands the need for monitoring LFTs.  He will keep an eye out for any muscle related symptoms.  I am going to check an LP(a) and a lipo med panel for advanced lipid testing to get baseline studies done as he starts on a statin drug.

## 2024-07-19 ENCOUNTER — Ambulatory Visit: Payer: Self-pay | Admitting: Cardiovascular Disease

## 2024-07-19 LAB — NMR, LIPOPROFILE
Cholesterol, Total: 205 mg/dL — ABNORMAL HIGH (ref 100–199)
HDL Particle Number: 36.2 umol/L (ref >=30.5)
HDL-C: 65 mg/dL (ref >39)
LDL Particle Number: 1058 nmol/L — ABNORMAL HIGH (ref <1000)
LDL Size: 21.5 nm (ref >20.5)
LDL-C (NIH Calc): 131 mg/dL — ABNORMAL HIGH (ref 0–99)
LP-IR Score: <25 (ref <=45)
Small LDL Particle Number: 281 nmol/L (ref <=527)
Triglycerides: 51 mg/dL (ref 0–149)

## 2024-07-19 LAB — LIPOPROTEIN A (LPA): Lipoprotein (a): <8.4 nmol/L (ref <75.0)

## 2024-08-14 ENCOUNTER — Encounter: Payer: Self-pay | Admitting: Cardiovascular Disease

## 2024-11-09 ENCOUNTER — Telehealth: Payer: Self-pay

## 2024-11-09 NOTE — Telephone Encounter (Signed)
 Spoke with patient about e-visit. He is good at this time but while use if he starts having symptoms.

## 2024-11-09 NOTE — Telephone Encounter (Signed)
 Copied from CRM [id number]. Topic: Clinical - Medication Question >> Nov 07, 2024 12:21 PM Aisha D wrote: Reason for CRM: Pt stated that his son tested positive for flu a and is requesting a prescription for tamiflu  to prevent getting the flu. Pt would like a callback with an update on this request.

## 2024-11-09 NOTE — Telephone Encounter (Signed)
 Son tested +. He want to take something preemptively. Will us  evist if need be.

## 2024-11-27 ENCOUNTER — Other Ambulatory Visit (HOSPITAL_COMMUNITY): Payer: Self-pay

## 2024-11-27 ENCOUNTER — Encounter: Payer: Self-pay | Admitting: Family Medicine

## 2024-11-29 ENCOUNTER — Ambulatory Visit (INDEPENDENT_AMBULATORY_CARE_PROVIDER_SITE_OTHER): Payer: 59 | Admitting: Family Medicine

## 2024-11-29 ENCOUNTER — Encounter: Payer: Self-pay | Admitting: Family Medicine

## 2024-11-29 ENCOUNTER — Ambulatory Visit: Payer: Self-pay | Admitting: Family Medicine

## 2024-11-29 VITALS — BP 110/60 | HR 79 | Temp 97.2°F | Ht 70.0 in | Wt 177.4 lb

## 2024-11-29 DIAGNOSIS — Z131 Encounter for screening for diabetes mellitus: Secondary | ICD-10-CM | POA: Diagnosis not present

## 2024-11-29 DIAGNOSIS — Z0000 Encounter for general adult medical examination without abnormal findings: Secondary | ICD-10-CM

## 2024-11-29 DIAGNOSIS — Z125 Encounter for screening for malignant neoplasm of prostate: Secondary | ICD-10-CM | POA: Diagnosis not present

## 2024-11-29 DIAGNOSIS — E785 Hyperlipidemia, unspecified: Secondary | ICD-10-CM | POA: Diagnosis not present

## 2024-11-29 DIAGNOSIS — Z23 Encounter for immunization: Secondary | ICD-10-CM

## 2024-11-29 DIAGNOSIS — E663 Overweight: Secondary | ICD-10-CM | POA: Diagnosis not present

## 2024-11-29 LAB — COMPREHENSIVE METABOLIC PANEL WITH GFR
ALT: 18 U/L (ref 3–53)
AST: 16 U/L (ref 5–37)
Albumin: 4.6 g/dL (ref 3.5–5.2)
Alkaline Phosphatase: 92 U/L (ref 39–117)
BUN: 30 mg/dL — ABNORMAL HIGH (ref 6–23)
CO2: 31 meq/L (ref 19–32)
Calcium: 9.5 mg/dL (ref 8.4–10.5)
Chloride: 102 meq/L (ref 96–112)
Creatinine, Ser: 1.22 mg/dL (ref 0.40–1.50)
GFR: 68.98 mL/min
Glucose, Bld: 94 mg/dL (ref 70–99)
Potassium: 4.3 meq/L (ref 3.5–5.1)
Sodium: 136 meq/L (ref 135–145)
Total Bilirubin: 0.7 mg/dL (ref 0.2–1.2)
Total Protein: 6.9 g/dL (ref 6.0–8.3)

## 2024-11-29 LAB — LIPID PANEL
Cholesterol: 159 mg/dL (ref 28–200)
HDL: 61.1 mg/dL
LDL Cholesterol: 79 mg/dL (ref 10–99)
NonHDL: 98.21
Total CHOL/HDL Ratio: 3
Triglycerides: 98 mg/dL (ref 10.0–149.0)
VLDL: 19.6 mg/dL (ref 0.0–40.0)

## 2024-11-29 LAB — URINALYSIS, ROUTINE W REFLEX MICROSCOPIC
Bilirubin Urine: NEGATIVE
Hgb urine dipstick: NEGATIVE
Ketones, ur: NEGATIVE
Leukocytes,Ua: NEGATIVE
Nitrite: NEGATIVE
RBC / HPF: NONE SEEN
Specific Gravity, Urine: <=1.005 — AB (ref 1.000–1.030)
Total Protein, Urine: NEGATIVE
Urine Glucose: NEGATIVE
Urobilinogen, UA: 0.2 (ref 0.0–1.0)
WBC, UA: NONE SEEN
pH: 6 (ref 5.0–8.0)

## 2024-11-29 LAB — CBC WITH DIFFERENTIAL/PLATELET
Basophils Absolute: 0 K/uL (ref 0.0–0.1)
Basophils Relative: 0.6 % (ref 0.0–3.0)
Eosinophils Absolute: 0 K/uL (ref 0.0–0.7)
Eosinophils Relative: 0.7 % (ref 0.0–5.0)
HCT: 42.3 % (ref 39.0–52.0)
Hemoglobin: 14.8 g/dL (ref 13.0–17.0)
Lymphocytes Relative: 21.6 % (ref 12.0–46.0)
Lymphs Abs: 1.3 K/uL (ref 0.7–4.0)
MCHC: 35 g/dL (ref 30.0–36.0)
MCV: 94.6 fl (ref 78.0–100.0)
Monocytes Absolute: 0.4 K/uL (ref 0.1–1.0)
Monocytes Relative: 6.3 % (ref 3.0–12.0)
Neutro Abs: 4.3 K/uL (ref 1.4–7.7)
Neutrophils Relative %: 70.8 % (ref 43.0–77.0)
Platelets: 221 K/uL (ref 150.0–400.0)
RBC: 4.47 Mil/uL (ref 4.22–5.81)
RDW: 12.6 % (ref 11.5–15.5)
WBC: 6.1 K/uL (ref 4.0–10.5)

## 2024-11-29 LAB — HEMOGLOBIN A1C: Hgb A1c MFr Bld: 5.3 % (ref 4.6–6.5)

## 2024-11-29 LAB — PSA: PSA: 0.38 ng/mL (ref 0.10–4.00)

## 2024-11-29 MED ORDER — ZOLPIDEM TARTRATE 10 MG PO TABS: 10.0000 mg | ORAL_TABLET | Freq: Every evening | ORAL | 1 refills | Status: AC | PRN

## 2024-11-29 NOTE — Progress Notes (Signed)
 " Phone: [phone]   Subjective:  Patient presents today for their annual physical. Chief complaint-noted.   See problem oriented charting- ROS- full  review of systems was completed and negative  except for topics noted under acute/chronic concerns  The following were reviewed and entered/updated in epic: Past Medical History:  Diagnosis Date   Hyperlipidemia    Insomnia    Patient Active Problem List   Diagnosis Date Noted   HNP (herniated nucleus pulposus), lumbar 12/21/2017    Priority: Low   Left anterior knee pain 08/10/2016    Priority: Low   Posterior left knee pain 05/27/2015    Priority: Low   Insomnia 09/08/2021   Hyperlipidemia 09/08/2021   Past Surgical History:  Procedure Laterality Date   HYDROCELE EXCISION / REPAIR     LUMBAR LAMINECTOMY/DECOMPRESSION MICRODISCECTOMY Left 12/22/2017   Procedure: LEFT LUMBAR FIVE-SACRAL ONE  LAMINECTOMY and DISCECTOMY;  Surgeon: Gillie Duncans, MD;  Location: [facility] OR;  Service: Neurosurgery;  Laterality: Left;   SHOULDER ARTHROSCOPY Left    TENDON REPAIR     finger    Family History  Problem Relation Age of Onset   Healthy Mother    Pancreatic cancer Father        age 6   Atrial fibrillation Father        8s   Throat cancer Sister        47 never smoker nor exposure. chemo/radiation- doing ok   Breast cancer Paternal Grandmother        died in 105s   Colon cancer Neg Hx    Esophageal cancer Neg Hx    Stomach cancer Neg Hx    Rectal cancer Neg Hx    Colon polyps Neg Hx     Medications- reviewed and updated Current Outpatient Medications  Medication Sig Dispense Refill   ascorbic acid (VITAMIN C) 500 MG tablet 1 tablet Orally Once a day; Duration: 30 day(s)     Flaxseed, Linseed, (FLAX SEED OIL PO) Orally Once a day     Glucos-Chondroit-Hyaluron-MSM (GLUCOSAMINE CHONDROITIN JOINT PO) Orally Once a day; Duration: 30 day(s)     multivitamin (ONE-A-DAY MEN'S) TABS tablet Take 1 tablet by mouth daily.      rosuvastatin  (CRESTOR ) 10 MG tablet Take 1 tablet (10 mg total) by mouth daily. 90 tablet 3   zolpidem  (AMBIEN ) 10 MG tablet as needed.     No current facility-administered medications for this visit.    Allergies-reviewed and updated Allergies[1]  Social History   Social History Narrative   Married. 3 kids 10, 12, 14 in 20[facility].       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 Oropharynx normal Neck: no thyromegaly CV: RRR no murmurs rubs or gallops Lungs: CTAB no crackles, wheeze, rhonchi Abdomen: soft/nontender/nondistended/normal bowel sounds. No rebound or guarding.  Ext: no edema Skin: warm, dry Neuro: grossly normal, moves all extremities, PERRLA   Assessment and Plan  49 y.o. male presenting for annual physical.  Health Maintenance counseling: 1. Anticipatory guidance: Patient counseled regarding regular dental exams -q6 months, eye exams -yearly,  avoiding smoking and second hand smoke , limiting alcohol to 2 beverages per day - dry January- abotu 5-8 a week, no illicit drugs .   2. Risk factor reduction:  Advised patient of need for regular exercise and diet rich and fruits and vegetables to reduce risk of heart attack and stroke.  Exercise- excellent still.  Diet/weight management-healthy weight for muscle mass.  Wt Readings from Last 3 Encounters:  11/29/24 177 lb 6.4 oz (80.5 kg)  07/18/24 177 lb 6.4 oz (80.5 kg)  11/28/23 174 lb 6.4 oz (79.1 kg)  3. Immunizations/screenings/ancillary studies-  shingrix  #1 today and will come back, defer Prevnar for now Immunization History  Administered Date(s) Administered   DTaP 05/25/2012   Fluzone Influenza virus vaccine,trivalent (IIV3), split virus 11/20/2014, 09/15/2016   Hep A / Hep B 11/13/2015   Hepatitis A, Adult 06/13/2015   Hepatitis A, Ped/Adol-2 Dose 02/18/2011   IPV 02/18/2011   Influenza, Seasonal, Injecte, Preservative Fre 11/28/2023   Influenza,inj,Quad PF,6+ Mos 09/08/2021, 09/10/2022   MMR 02/18/2011   PFIZER(Purple Top)SARS-COV-2 Vaccination 01/19/2020, 02/06/2020, 10/07/2020, 11/15/2020   Rabies, IM 05/09/2014, 05/12/2014, 05/16/2014   Tdap 02/18/2011, 11/15/2017   Typhoid Inactivated 02/18/2011  4. Prostate cancer screening- low risk prior trend- update psa today Lab Results  Component Value Date   PSA 0.42 11/28/2023   PSA 0.23 09/10/2022   5. Colon cancer screening - May 2023 with Dr. Albertus with 5-year repeat planned 6. Skin cancer screening- dermatology yearly. advised regular sunscreen use. Denies worrisome, changing, or new skin lesions.  7. Smoking associated screening (lung cancer screening, AAA screen 65-75, UA)- never smoker 8. STD screening - only active with wife  Status of chronic or acute concerns   #genetic screening- he plans to do this- working with customer service  Mychart concerns  4) Small aches and pains department:    A) Elbow tendon connection at L elbow. tender / pain. Options for brace. especially painful when grasping / moving firewood B) Thoughts on dry needling for hamstring tendonitis (9 months ongoing).This continued issue impacting my running speed and frequency.  Other options? C) Prescription cream for tonail fungus? Lamisil over the counter not working. - has seen Dr. Deanie and now working with chiropractor- has been diagnosed proximal hamstring tendonitis. Slightly calmed down- may try to get into exercise portion. Avoiding triggers -as far as   #onychomycosis- we  talked about several treatment(s) options- he wants to try vicks vaporub extended but if not effecitve may reach out for oral lamisil. He does not need a separate visit ofr this.   I recommend you have a repeat colonoscopy in 5 years to determine if you have developed any new polyps and to screen for colorectal cancer.   # Nonobstructive CAD-has seen Dr. Wonda #hyperlipidemia-CT calcium  score of 2 but 64th percentile S: Medication:Rosuvastatin  10 mg daily. Notes a lot of aches and pains with it- knees, low back- weak points have flared. Came off and resolved and then restarted every other day MWF.  -LPA-not elevated -no chest pain and shortness of breath and still exercising high level Lab Results  Component Value Date   CHOL 214 (H) 11/28/2023   HDL 62.60 11/28/2023   LDLCALC 130 (H) 11/28/2023   TRIG 107.0 11/28/2023   CHOLHDL 3 11/28/2023  A/P: nonobstructive CAD asymptomatic - follow up with Dr. Wonda - trying to work around statin myalgia- doing  MWF and may add coenzyme q10  #international travel- Ambien  helpful  Recommended follow up: Return in about 1 year (around 11/29/2025) for physical or sooner if needed.Schedule b4 you leave.  Lab/Order associations:not fasting- coffee with milk   ICD-10-CM   1. Preventative health care  Z00.00     2. Immunization due  Z23 Varicella-zoster vaccine IM    3. Hyperlipidemia, unspecified hyperlipidemia type  E78.5     4. Overweight  E66.3     5. Screening for diabetes mellitus  Z13.1     6. Screening for prostate cancer  Z12.5       No orders of the defined types were placed in this encounter.   Return precautions advised.  Garnette Lukes, MD      [1] No Known Allergies  "

## 2024-11-29 NOTE — Patient Instructions (Addendum)
 Shingrix  #1 today. Repeat injection in 2-6 months. Schedule a nurse visit for the 2nd injection before you leave today (at the check out desk)  Trying to work around statin myalgia- doing MWF and may add coenzyme q10  Try counterforce brace, relative rest Team please give him exercises for golfers elbow/medial epicondylitis I want you to do the exercise 3x a week for a month then once a week for another month. Stop any exercise that causes more than 1-2/10 pain increase. If not doing better within 1-2 months let us  refer you to sports medicine or physical therapy   Please stop by lab before you go If you have mychart- we will send your results within 3 business days of us  receiving them.  If you do not have mychart- we will call you about results within 5 business days of us  receiving them.  *please also note that you will see labs on mychart as soon as they post. I will later go in and write notes on them- will say notes from Dr. Katrinka   Recommended follow up: Return in about 1 year (around 11/29/2025) for physical or sooner if needed.Schedule b4 you leave.

## 2024-12-14 ENCOUNTER — Ambulatory Visit: Admitting: Family Medicine

## 2024-12-14 ENCOUNTER — Encounter: Payer: Self-pay | Admitting: Family Medicine

## 2024-12-14 VITALS — BP 108/62 | HR 75 | Temp 99.1°F | Ht 70.0 in | Wt 177.6 lb

## 2024-12-14 DIAGNOSIS — J029 Acute pharyngitis, unspecified: Secondary | ICD-10-CM

## 2024-12-14 DIAGNOSIS — R059 Cough, unspecified: Secondary | ICD-10-CM

## 2024-12-14 LAB — POC COVID19 BINAXNOW: SARS Coronavirus 2 Ag: NEGATIVE

## 2024-12-14 LAB — POCT INFLUENZA A/B
Influenza A, POC: NEGATIVE
Influenza B, POC: NEGATIVE

## 2024-12-14 LAB — POCT RAPID STREP A (OFFICE): Rapid Strep A Screen: POSITIVE — AB

## 2024-12-14 MED ORDER — AMOXICILLIN-POT CLAVULANATE 875-125 MG PO TABS: 1.0000 | ORAL_TABLET | Freq: Two times a day (BID) | ORAL | 0 refills | Status: AC

## 2024-12-14 MED ORDER — PROMETHAZINE-DM 6.25-15 MG/5ML PO SYRP: 5.0000 mL | ORAL_SOLUTION | Freq: Four times a day (QID) | ORAL | 0 refills | Status: AC | PRN

## 2024-12-20 ENCOUNTER — Encounter: Payer: Self-pay | Admitting: Family Medicine

## 2024-12-21 ENCOUNTER — Encounter: Payer: Self-pay | Admitting: Family

## 2024-12-21 ENCOUNTER — Ambulatory Visit: Admitting: Family

## 2024-12-21 VITALS — BP 130/78 | HR 64 | Temp 97.0°F | Ht 70.0 in | Wt 177.2 lb

## 2024-12-21 DIAGNOSIS — B349 Viral infection, unspecified: Secondary | ICD-10-CM

## 2024-12-21 MED ORDER — BENZONATATE 100 MG PO CAPS: 100.0000 mg | ORAL_CAPSULE | Freq: Three times a day (TID) | ORAL | 0 refills | Status: AC | PRN

## 2024-12-21 MED ORDER — PREDNISONE 10 MG PO TABS: ORAL_TABLET | ORAL | 0 refills | Status: AC

## 2024-12-21 NOTE — Progress Notes (Signed)
 "  Patient ID: Patrick Clayton, male    DOB: 08-25-1976, 49 y.o.   MRN: [id number]  Chief Complaint  Patient presents with   Cough    Pt c/o cough, wheezing, yellow to clear mucus. dx with bronchitis on 01/30. Has tried Amoxicillin  and promethazine  cough syrup. Walking and going up stairs causes more SOB and wheezing.   Discussed the use of AI scribe software for clinical note transcription with the patient, who gave verbal consent to proceed.  History of Present Illness Patrick Clayton is a 49 year old male who presents with persistent cough and shortness of breath following a recent flu-like illness.  Approximately two weeks ago, he developed flu-like symptoms with fever while traveling in [place]. The fever resolved after 48 hours, but his illness progressed to involve the chest with persistent cough and shortness of breath.  For the past ten days, he has had a productive cough. Sputum has improved from yellow to more clear. Cough and fatigue worsen as the day progresses, with mornings relatively better. He reports persistent SOB. He is on a ten-day course of antibiotics on 1/30 for strep throat, with three days remaining and uses a neti pot for minimal sinus congestion. He has no more throat pain, ear pain, significant sinus congestion, or asthma. He uses cough syrup, which helps him sleep by reducing cough, especially when lying flat. He also uses Mucinex and drinks plenty of water to thin mucus. He reports persistent wheezing and shortness of breath, especially when lying flat.  Assessment & Plan Acute bronchitis with bronchospasm Symptoms include persistent cough, shortness of breath, and wheezing. Improvement in mucus color noted. Antibiotics addressing bacterial component. Steroids recommended for inflammation and bronchospasm. Discussed prednisone  side effects. Inhaler considered but steroids prioritized. Tessalon  Perles and Mucinex suggested for symptom management. - Prescribed  prednisone  taper for six days, to be taken in the morning after eating. - Continue current antibiotic regimen. - Consider Tessalon  Perles 100-200mg  tid for cough management if needed. - Continue Mucinex to help loosen mucus. - Encourage increased water intake, especially if consuming caffeine. - Finish current cough syrup if beneficial for sleep. - Call back if sx persist after finishing prednisone    Subjective:    Outpatient Medications Prior to Visit  Medication Sig Dispense Refill   amoxicillin -clavulanate (AUGMENTIN ) 875-125 MG tablet Take 1 tablet by mouth 2 (two) times daily. 20 tablet 0   ascorbic acid (VITAMIN C) 500 MG tablet 1 tablet Orally Once a day; Duration: 30 day(s)     Flaxseed, Linseed, (FLAX SEED OIL PO) Orally Once a day     Glucos-Chondroit-Hyaluron-MSM (GLUCOSAMINE CHONDROITIN JOINT PO) Orally Once a day; Duration: 30 day(s)     multivitamin (ONE-A-DAY MEN'S) TABS tablet Take 1 tablet by mouth daily.     promethazine -dextromethorphan (PROMETHAZINE -DM) 6.25-15 MG/5ML syrup Take 5 mLs by mouth 4 (four) times daily as needed. 118 mL 0   rosuvastatin  (CRESTOR ) 10 MG tablet Take 1 tablet (10 mg total) by mouth daily. 90 tablet 3   zolpidem  (AMBIEN ) 10 MG tablet Take 1 tablet (10 mg total) by mouth at bedtime as needed for sleep (Do not drive for 8 hours after taking). 30 tablet 1   No facility-administered medications prior to visit.   Past Medical History:  Diagnosis Date   Hyperlipidemia    Insomnia    Past Surgical History:  Procedure Laterality Date   HYDROCELE EXCISION / REPAIR     LUMBAR LAMINECTOMY/DECOMPRESSION MICRODISCECTOMY Left 12/22/2017   Procedure: LEFT LUMBAR  FIVE-SACRAL ONE  LAMINECTOMY and DISCECTOMY;  Surgeon: Gillie Duncans, MD;  Location: [facility] OR;  Service: Neurosurgery;  Laterality: Left;   SHOULDER ARTHROSCOPY Left    TENDON REPAIR     finger   Allergies[1]    Objective:    Physical Exam Vitals and nursing note reviewed.   Constitutional:      General: He is not in acute distress.    Appearance: Normal appearance.  HENT:     Head: Normocephalic.  Cardiovascular:     Rate and Rhythm: Normal rate and regular rhythm.  Pulmonary:     Effort: Pulmonary effort is normal.     Breath sounds: Examination of the right-middle field reveals rhonchi. Examination of the right-lower field reveals rhonchi. Examination of the left-lower field reveals rhonchi. Rhonchi (mild) present.  Musculoskeletal:        General: Normal range of motion.     Cervical back: Normal range of motion.  Skin:    General: Skin is warm and dry.  Neurological:     Mental Status: He is alert and oriented to person, place, and time.  Psychiatric:        Mood and Affect: Mood normal.    BP 130/78 (BP Location: Left Arm, Patient Position: Sitting, Cuff Size: Large)   Pulse 64   Temp (!) 97 F (36.1 C) (Temporal)   Ht 5' 10 (1.778 m)   Wt 177 lb 4 oz (80.4 kg)   SpO2 96%   BMI 25.43 kg/m  Wt Readings from Last 3 Encounters:  12/21/24 177 lb 4 oz (80.4 kg)  12/14/24 177 lb 9.6 oz (80.6 kg)  11/29/24 177 lb 6.4 oz (80.5 kg)      Dick Hark, NP     [1] No Known Allergies  "

## 2025-02-26 ENCOUNTER — Ambulatory Visit

## 2025-12-03 ENCOUNTER — Encounter: Admitting: Family Medicine
